# Patient Record
Sex: Female | Born: 1990 | Race: Black or African American | Hispanic: No | Marital: Single | Smoking: Former smoker
Health system: Southern US, Community
[De-identification: ages and names within clinical notes are randomized; demographics above are authoritative.]

## PROBLEM LIST (undated history)

## (undated) ENCOUNTER — Inpatient Hospital Stay (HOSPITAL_COMMUNITY): Payer: Self-pay

## (undated) DIAGNOSIS — D219 Benign neoplasm of connective and other soft tissue, unspecified: Secondary | ICD-10-CM

---
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
## 2009-10-30 ENCOUNTER — Inpatient Hospital Stay (HOSPITAL_COMMUNITY): Admission: AD | Admit: 2009-10-30 | Discharge: 2009-10-30 | Payer: Self-pay | Admitting: Obstetrics & Gynecology

## 2010-09-01 LAB — URINALYSIS, ROUTINE W REFLEX MICROSCOPIC
Bilirubin Urine: NEGATIVE
Glucose, UA: NEGATIVE mg/dL
Leukocytes, UA: NEGATIVE
Nitrite: NEGATIVE
Specific Gravity, Urine: >1.03 — ABNORMAL HIGH (ref 1.005–1.030)

## 2010-09-01 LAB — WET PREP, GENITAL
Trich, Wet Prep: NONE SEEN
Yeast Wet Prep HPF POC: NONE SEEN

## 2010-09-01 LAB — GC/CHLAMYDIA PROBE AMP, GENITAL: Chlamydia, DNA Probe: NEGATIVE

## 2010-09-01 LAB — URINE MICROSCOPIC-ADD ON

## 2010-09-01 LAB — CBC
MCV: 96 fL (ref 78.0–100.0)
WBC: 6.7 10*3/uL (ref 4.0–10.5)

## 2010-09-01 LAB — ABO/RH: ABO/RH(D): O POS

## 2010-09-01 LAB — HCG, QUANTITATIVE, PREGNANCY: hCG, Beta Chain, Quant, S: 21216 m[IU]/mL — ABNORMAL HIGH (ref <5)

## 2010-09-06 ENCOUNTER — Emergency Department (HOSPITAL_COMMUNITY): Payer: Self-pay

## 2010-09-06 ENCOUNTER — Emergency Department (HOSPITAL_COMMUNITY)
Admission: EM | Admit: 2010-09-06 | Discharge: 2010-09-06 | Disposition: A | Discharge status: Home / Self Care | Payer: Self-pay | Attending: Emergency Medicine | Admitting: Emergency Medicine

## 2010-09-06 DIAGNOSIS — R0789 Other chest pain: Secondary | ICD-10-CM | POA: Insufficient documentation

## 2010-09-06 DIAGNOSIS — M940 Chondrocostal junction syndrome [Tietze]: Secondary | ICD-10-CM | POA: Insufficient documentation

## 2010-09-06 DIAGNOSIS — R42 Dizziness and giddiness: Secondary | ICD-10-CM | POA: Insufficient documentation
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
## 2010-11-14 ENCOUNTER — Inpatient Hospital Stay (HOSPITAL_COMMUNITY)
Admission: AD | Admit: 2010-11-14 | Discharge: 2010-11-14 | Disposition: A | Discharge status: Home / Self Care | Payer: Medicaid Other | Source: Ambulatory Visit | Attending: Obstetrics & Gynecology | Admitting: Obstetrics & Gynecology

## 2010-11-14 ENCOUNTER — Inpatient Hospital Stay (HOSPITAL_COMMUNITY): Payer: Medicaid Other

## 2010-11-14 DIAGNOSIS — R109 Unspecified abdominal pain: Secondary | ICD-10-CM | POA: Insufficient documentation

## 2010-11-14 DIAGNOSIS — O99891 Other specified diseases and conditions complicating pregnancy: Secondary | ICD-10-CM | POA: Insufficient documentation

## 2010-11-14 DIAGNOSIS — O9989 Other specified diseases and conditions complicating pregnancy, childbirth and the puerperium: Secondary | ICD-10-CM

## 2010-11-14 LAB — POCT PREGNANCY, URINE: Preg Test, Ur: POSITIVE

## 2010-11-14 LAB — URINALYSIS, ROUTINE W REFLEX MICROSCOPIC
Glucose, UA: NEGATIVE mg/dL
Nitrite: NEGATIVE
Protein, ur: NEGATIVE mg/dL
pH: 6 (ref 5.0–8.0)

## 2010-11-15 LAB — GC/CHLAMYDIA PROBE AMP, GENITAL
Chlamydia, DNA Probe: NEGATIVE
GC Probe Amp, Genital: NEGATIVE

## 2010-12-18 LAB — HEPATITIS B SURFACE ANTIGEN: Hepatitis B Surface Ag: NEGATIVE

## 2010-12-18 LAB — RUBELLA ANTIBODY, IGM: Rubella: IMMUNE

## 2010-12-18 LAB — RPR: RPR: NONREACTIVE

## 2011-02-20 ENCOUNTER — Inpatient Hospital Stay (HOSPITAL_COMMUNITY): Payer: Medicaid Other

## 2011-02-20 ENCOUNTER — Inpatient Hospital Stay (HOSPITAL_COMMUNITY)
Admission: AD | Admit: 2011-02-20 | Discharge: 2011-02-22 | DRG: 781 | Disposition: A | Discharge status: Home / Self Care | Payer: Medicaid Other | Source: Ambulatory Visit | Attending: Obstetrics and Gynecology | Admitting: Obstetrics and Gynecology

## 2011-02-20 ENCOUNTER — Encounter (HOSPITAL_COMMUNITY): Payer: Self-pay | Admitting: *Deleted

## 2011-02-20 DIAGNOSIS — O26899 Other specified pregnancy related conditions, unspecified trimester: Secondary | ICD-10-CM

## 2011-02-20 DIAGNOSIS — R102 Pelvic and perineal pain unspecified side: Secondary | ICD-10-CM

## 2011-02-20 DIAGNOSIS — O210 Mild hyperemesis gravidarum: Secondary | ICD-10-CM | POA: Diagnosis present

## 2011-02-20 DIAGNOSIS — R1031 Right lower quadrant pain: Secondary | ICD-10-CM | POA: Diagnosis present

## 2011-02-20 DIAGNOSIS — O99891 Other specified diseases and conditions complicating pregnancy: Principal | ICD-10-CM | POA: Diagnosis present

## 2011-02-20 HISTORY — DX: Benign neoplasm of connective and other soft tissue, unspecified: D21.9

## 2011-02-20 LAB — COMPREHENSIVE METABOLIC PANEL
AST: 30 U/L (ref 0–37)
Alkaline Phosphatase: 46 U/L (ref 39–117)
BUN: 8 mg/dL (ref 6–23)
CO2: 24 mEq/L (ref 19–32)
Chloride: 101 mEq/L (ref 96–112)
Creatinine, Ser: <0.47 mg/dL — ABNORMAL LOW (ref 0.50–1.10)
Total Bilirubin: 0.2 mg/dL — ABNORMAL LOW (ref 0.3–1.2)

## 2011-02-20 LAB — CBC
HCT: 34.5 % — ABNORMAL LOW (ref 36.0–46.0)
MCV: 94.5 fL (ref 78.0–100.0)
Platelets: 270 10*3/uL (ref 150–400)
RBC: 3.65 MIL/uL — ABNORMAL LOW (ref 3.87–5.11)
WBC: 9.8 10*3/uL (ref 4.0–10.5)

## 2011-02-20 LAB — URINALYSIS, ROUTINE W REFLEX MICROSCOPIC
Bilirubin Urine: NEGATIVE
Glucose, UA: NEGATIVE mg/dL
Ketones, ur: NEGATIVE mg/dL
Protein, ur: NEGATIVE mg/dL
Specific Gravity, Urine: 1.01 (ref 1.005–1.030)
pH: 6 (ref 5.0–8.0)

## 2011-02-20 LAB — AMYLASE: Amylase: 136 U/L — ABNORMAL HIGH (ref 0–105)

## 2011-02-20 LAB — LIPASE, BLOOD: Lipase: 36 U/L (ref 11–59)

## 2011-02-20 LAB — WET PREP, GENITAL

## 2011-02-20 MED ORDER — LACTATED RINGERS IV SOLN
INTRAVENOUS | Status: DC
  Administered 2011-02-20: 125 mL/h via INTRAVENOUS
  Administered 2011-02-20 – 2011-02-22 (×6): via INTRAVENOUS

## 2011-02-20 MED ORDER — LACTATED RINGERS IV BOLUS (SEPSIS)
500.0000 mL | Freq: Once | INTRAVENOUS | Status: AC
  Administered 2011-02-20: 1000 mL via INTRAVENOUS

## 2011-02-20 MED ORDER — BUTORPHANOL TARTRATE 2 MG/ML IJ SOLN
INTRAMUSCULAR | Status: AC
  Administered 2011-02-20: 1 mg via INTRAVENOUS
  Filled 2011-02-20: qty 1

## 2011-02-20 MED ORDER — CALCIUM CARBONATE ANTACID 500 MG PO CHEW: 2.0000 | CHEWABLE_TABLET | ORAL | Status: DC | PRN

## 2011-02-20 MED ORDER — DOCUSATE SODIUM 100 MG PO CAPS
100.0000 mg | ORAL_CAPSULE | Freq: Every day | ORAL | Status: DC
  Administered 2011-02-21 – 2011-02-22 (×2): 100 mg via ORAL
  Filled 2011-02-20 (×2): qty 1

## 2011-02-20 MED ORDER — IBUPROFEN 600 MG PO TABS
600.0000 mg | ORAL_TABLET | Freq: Four times a day (QID) | ORAL | Status: DC | PRN
  Administered 2011-02-21: 600 mg via ORAL
  Filled 2011-02-20: qty 1

## 2011-02-20 MED ORDER — ZOLPIDEM TARTRATE 10 MG PO TABS
10.0000 mg | ORAL_TABLET | Freq: Every evening | ORAL | Status: DC | PRN
  Administered 2011-02-21: 10 mg via ORAL
  Filled 2011-02-20 (×2): qty 1

## 2011-02-20 MED ORDER — BUTORPHANOL TARTRATE 2 MG/ML IJ SOLN
1.0000 mg | INTRAMUSCULAR | Status: DC | PRN
  Administered 2011-02-20 – 2011-02-21 (×3): 1 mg via INTRAVENOUS
  Filled 2011-02-20 (×2): qty 1

## 2011-02-20 MED ORDER — ACETAMINOPHEN 500 MG PO TABS
1000.0000 mg | ORAL_TABLET | Freq: Four times a day (QID) | ORAL | Status: DC | PRN
  Administered 2011-02-20: 1000 mg via ORAL
  Filled 2011-02-20: qty 2

## 2011-02-20 MED ORDER — PRENATAL PLUS 27-1 MG PO TABS
1.0000 | ORAL_TABLET | Freq: Every day | ORAL | Status: DC
  Administered 2011-02-21 – 2011-02-22 (×2): 1 via ORAL
  Filled 2011-02-20 (×2): qty 1

## 2011-02-20 NOTE — ED Notes (Signed)
Pt to be evaluated in BS #166

## 2011-02-21 ENCOUNTER — Encounter (HOSPITAL_COMMUNITY): Payer: Self-pay | Admitting: *Deleted

## 2011-02-21 ENCOUNTER — Inpatient Hospital Stay (HOSPITAL_COMMUNITY): Payer: Medicaid Other

## 2011-02-21 ENCOUNTER — Ambulatory Visit (HOSPITAL_COMMUNITY)
Admission: EM | Admit: 2011-02-21 | Discharge: 2011-02-21 | Disposition: A | Discharge status: Home / Self Care | Payer: Medicaid Other | Source: Ambulatory Visit | Attending: Obstetrics and Gynecology | Admitting: Obstetrics and Gynecology

## 2011-02-21 LAB — DIFFERENTIAL
Basophils Relative: 0 % (ref 0–1)
Eosinophils Absolute: 0.1 10*3/uL (ref 0.0–0.7)
Lymphs Abs: 1.5 10*3/uL (ref 0.7–4.0)
Monocytes Relative: 8 % (ref 3–12)
Neutro Abs: 10.4 10*3/uL — ABNORMAL HIGH (ref 1.7–7.7)
Neutrophils Relative %: 80 % — ABNORMAL HIGH (ref 43–77)

## 2011-02-21 LAB — COMPREHENSIVE METABOLIC PANEL
ALT: 43 U/L — ABNORMAL HIGH (ref 0–35)
Albumin: 2.6 g/dL — ABNORMAL LOW (ref 3.5–5.2)
Alkaline Phosphatase: 51 U/L (ref 39–117)
BUN: 6 mg/dL (ref 6–23)
Chloride: 102 mEq/L (ref 96–112)
Glucose, Bld: 98 mg/dL (ref 70–99)
Potassium: 3.5 mEq/L (ref 3.5–5.1)
Sodium: 134 mEq/L — ABNORMAL LOW (ref 135–145)
Total Bilirubin: 0.3 mg/dL (ref 0.3–1.2)
Total Protein: 6.4 g/dL (ref 6.0–8.3)

## 2011-02-21 LAB — GC/CHLAMYDIA PROBE AMP, GENITAL: Chlamydia, DNA Probe: NEGATIVE

## 2011-02-21 LAB — CBC
Hemoglobin: 11.5 g/dL — ABNORMAL LOW (ref 12.0–15.0)
Platelets: 254 10*3/uL (ref 150–400)
RBC: 3.6 MIL/uL — ABNORMAL LOW (ref 3.87–5.11)

## 2011-02-21 MED ORDER — BUTORPHANOL TARTRATE 2 MG/ML IJ SOLN
2.0000 mg | INTRAMUSCULAR | Status: DC | PRN
  Administered 2011-02-21 (×2): 2 mg via INTRAVENOUS
  Filled 2011-02-21 (×2): qty 1

## 2011-02-21 NOTE — H&P (Signed)
Rita Long is a 20 y.o. female presenting for onset of abdominal pain, points to suprapubic area and lateral groin area and sometimes around to middle back. Denies ctx, VB, LOF +FM Maternal Medical History:  Reason for admission: Reason for Admission:   nauseaFetal activity: Perceived fetal activity is normal.   Last perceived fetal movement was within the past hour.     Anat US revealed marginal cord insertion.   OB History    Grav Para Term Preterm Abortions TAB SAB Ect Mult Living   3 0 0 0 2          Past Medical History  Diagnosis Date  . Fibroid    History reviewed. No pertinent past surgical history. Family History: family history is not on file. Social History:  reports that she has never smoked. She has never used smokeless tobacco. She reports that she does not drink alcohol or use illicit drugs.  Review of Systems  Gastrointestinal: Positive for abdominal pain. Negative for nausea, vomiting and diarrhea.  Genitourinary: Negative for flank pain.  Musculoskeletal: Positive for back pain.  All other systems reviewed and are negative.    Dilation: Closed Effacement (%): Thick Exam by:: Dr Su Hilt Blood pressure 100/63, pulse 82, temperature 98.2 F (36.8 C), temperature source Oral, resp. rate 18, height 4\' 11"  (1.499 m), weight 66.225 kg (146 lb), SpO2 99.00%. Maternal Exam:  Abdomen: Patient reports generalized tenderness.  Patient reports the following abdominal tenderness: suprapubic.  Introitus: not evaluated.     Fetal Exam Fetal Monitor Review: Mode: hand-held doppler probe.       Physical Exam  Nursing note and vitals reviewed. Constitutional: She is oriented to person, place, and time. She appears well-developed and well-nourished.  Cardiovascular: Normal rate.   Respiratory: Effort normal.  GI: Soft. Bowel sounds are normal. She exhibits no distension. There is generalized tenderness and tenderness in the suprapubic area. There is guarding.  There is no rebound, no tenderness at McBurney's point and negative Murphy's sign.  Neurological: She is alert and oriented to person, place, and time.  Skin: Skin is warm and dry.  Psychiatric: She has a normal mood and affect. Her behavior is normal.    Prenatal labs: ABO, Rh:   Antibody: Negative (07/05 0000) Rubella:   RPR: Nonreactive (07/05 0000)  HBsAg: Negative (07/05 0000)  HIV: Non-reactive (07/05 0000)  GBS:     Assessment/Plan: Abdominal and OB US WNL Lab results WNL Pain somewhat decreased after stadol - patient requests tylenol  Will admit overnight to evaluate pain - per Dr Su Hilt Continue IVF's Pt may eat Tylenol and ambien PRN     Krystl Wickware M 02/21/2011, 6:37 AM

## 2011-02-21 NOTE — Progress Notes (Signed)
Subjective: Patient reports nausea and vomiting and abdominal pain and looks uncomfortable.  Pt points repeatedly to her RLQ.  Objective: I have reviewed patient's vital signs at initial triage eval and they are within normal limits.  General: alert and visibly uncomfortable Resp: clear to auscultation bilaterally Cardio: regular rate and rhythm, S1, S2 normal, no murmur, click, rub or gallop GI: gravid, + diffuse lower abdominal tenderness, no rebound, + guarding Extremities: extremities normal, atraumatic, no cyanosis or edema and Homans sign is negative, no sign of DVT Speculum: no abnormal d/c VE:c/l/p with bilateral and lower midline pelvic tenderness, no obvious CMT   Assessment/Plan: 20 yo G3P0 at 22 4/7 wks presenting primarily with c/o abdominal pain but also c/o nausea and episode of vomiting earlier today. Labs Ultrasound (Ob and Abdominal) Pain medicine per request Pt declining CT scan to eval for appendicitis but may consider MRI   LOS: 1 day    Rita Long Y 02/21/2011, 12:03 PM

## 2011-02-21 NOTE — Progress Notes (Signed)
Rita Long is a 20 y.o. G3P0020 at [redacted]w[redacted]d admitted for abdominal pain, n/v.  Subjective: Pt continues to c/o pain 10/10 even after the stadol.  Denies any nausea and tolerated dinner last night without difficult.  She c/o pressure in lower abdomen upon urination and the pain radiates around to her rt flank and also down left leg.  Objective: BP 105/68  Pulse 74  Temp(Src) 97.8 F (36.6 C) (Oral)  Resp 18  Ht 4\' 11"  (1.499 m)  Wt 66.225 kg (146 lb)  BMI 29.49 kg/m2  SpO2 100%   Lings: CTA bil CV: RRR Abdomen: soft, + lower abdominal tenderness, no bil CVAT Ext: no calf tenderness  FHT:  145 SVE:   Dilation: Closed Effacement (%): Thick Exam by:: Dr Su Hilt  Labs: Lab Results  Component Value Date   WBC 9.8 02/20/2011   HGB 11.7* 02/20/2011   HCT 34.5* 02/20/2011   MCV 94.5 02/20/2011   PLT 270 02/20/2011   Pertinent Postives UA with RBCs Amylase elevated Abdominal ultrasound - negative  Assessment / Plan: 20 yo G3P0 at 26 4/7wks with lower abdominal and right flank pain.  I discussed further imaging studies to eval pain esp for signs of kidney stone with RBCs in urine and patient continues to decline CT Scan but agrees to MRI.  Will order MRI for eval of kidney stone and eval for appendicitis, although low suspicion secondary to pt still has appetite and normal WBCs.    Purcell Nails 02/21/2011, 12:18 PM

## 2011-02-21 NOTE — Progress Notes (Addendum)
Patient complaining of lower abd pain, given Tylenol 1000mg  po @ 2300. Pain re assesed and has decreased. Pt asked for sleeping pill @ 0001. Patient was given Ambien 10mg  po. Pt. Has since slept through the night with no other complaints of pain. Pt. Scheduled for abd u/s this am. Will continue to monitor pt for complaints of pain or concerns.   Error, pt. Not scheduled for abd U/S in am. U/S was done 9/7 @ 2040.

## 2011-02-22 LAB — HEPATIC FUNCTION PANEL
ALT: 34 U/L (ref 0–35)
AST: 21 U/L (ref 0–37)
Albumin: 2.2 g/dL — ABNORMAL LOW (ref 3.5–5.2)
Alkaline Phosphatase: 43 U/L (ref 39–117)
Total Protein: 5.7 g/dL — ABNORMAL LOW (ref 6.0–8.3)

## 2011-02-22 LAB — CBC
Hemoglobin: 10.6 g/dL — ABNORMAL LOW (ref 12.0–15.0)
Platelets: 236 10*3/uL (ref 150–400)
RBC: 3.32 MIL/uL — ABNORMAL LOW (ref 3.87–5.11)
WBC: 7.6 10*3/uL (ref 4.0–10.5)

## 2011-02-22 LAB — AMYLASE: Amylase: 107 U/L — ABNORMAL HIGH (ref 0–105)

## 2011-02-22 LAB — DIFFERENTIAL
Eosinophils Absolute: 0.1 10*3/uL (ref 0.0–0.7)
Lymphocytes Relative: 21 % (ref 12–46)
Lymphs Abs: 1.6 10*3/uL (ref 0.7–4.0)
Monocytes Relative: 10 % (ref 3–12)
Neutro Abs: 5.1 10*3/uL (ref 1.7–7.7)
Neutrophils Relative %: 68 % (ref 43–77)

## 2011-02-22 NOTE — Discharge Summary (Signed)
Obstetric Discharge Summary Reason for Admission: abdominal pain Prenatal Procedures: observation and imaging studies Intrapartum Procedures: n/a Postpartum Procedures: n/a Complications-Operative and Postpartum: n/a Hemoglobin  Date Value Range Status  02/22/2011 10.6* 12.0-15.0 (g/dL) Final     HCT  Date Value Range Status  02/22/2011 32.0* 36.0-46.0 (%) Final    Discharge Diagnoses: Pregnant with Abdominal Pain (Resolved)  Discharge Information: Date: 02/22/2011 Activity: unrestricted Diet: routine Medications: PNV Condition: improved Instructions: d/c instructions reviewed...call with return or worsening of symptoms Discharge to: home Follow-up Information    Follow up with [facility]. (as scheduled...call with return or worsening of symptoms)          Newborn Data: This patient has no babies on file. Home with n/a.  Purcell Nails 02/22/2011, 2:05 PM

## 2011-02-22 NOTE — Discharge Summary (Signed)
Patient was very eager to leave, she had no complaints. After RN reviewed discharge instructions with her, she was walked off the unit by nurse tech. Patient left the campus in a private vehicle.

## 2011-02-22 NOTE — Progress Notes (Signed)
Rita Long is a 20 y.o. G3P0020 at [redacted]w[redacted]d admitted for severe abdominal pain.  Subjective: Pt says her pain is completely better today and she feels no pain at all.  Tolerating po, ambulating without difficulty and requesting to go home.  Denies any further n/v.  Objective: BP 117/74  Pulse 90  Temp(Src) 98.1 F (36.7 C) (Oral)  Resp 18  Ht 4\' 11"  (1.499 m)  Wt 66.225 kg (146 lb)  BMI 29.49 kg/m2  SpO2 100% I/O last 3 completed shifts: In: 5723.3 [P.O.:1440; I.V.:4283.3] Out: 1400 [Urine:1400] Total I/O In: 500 [P.O.:500] Out: 800 [Urine:800]  Lungs: CTA bil CV: RRR Abd: soft, NT Ext: no calf tenderness  FHT:  145 UC:   none SVE:   Dilation: Closed Effacement (%): Thick Exam by:: Dr Su Hilt  Labs: Lab Results  Component Value Date   WBC 7.6 02/22/2011   HGB 10.6* 02/22/2011   HCT 32.0* 02/22/2011   MCV 96.4 02/22/2011   PLT 236 02/22/2011    Assessment / Plan: 20yo G3P0 at 19 5/7wks doing well requesting d/c home.  D/c home. D/c instructions discussed.  f/u for scheduled appointment.  Amisha Pospisil Y 02/22/2011, 1:51 PM

## 2011-02-22 NOTE — Plan of Care (Signed)
Problem: Phase I Progression Outcomes Goal: Initial discharge plan identified Outcome: Progressing Pain control R/O Kidney stones and gallstones Assess fetal well being  Problem: Phase II Progression Outcomes Goal: Progress activity as tolerated unless otherwise ordered Outcome: Progressing Patient sits up in room and has walked in the halls

## 2011-02-23 LAB — URINE CULTURE
Colony Count: NO GROWTH
Culture: NO GROWTH

## 2011-02-23 LAB — CULTURE, BETA STREP (GROUP B ONLY)

## 2011-06-18 ENCOUNTER — Other Ambulatory Visit: Payer: Self-pay | Admitting: Obstetrics and Gynecology

## 2011-06-18 ENCOUNTER — Inpatient Hospital Stay (HOSPITAL_COMMUNITY)
Admission: AD | Admit: 2011-06-18 | Discharge: 2011-06-18 | Disposition: A | Discharge status: Home / Self Care | Payer: Medicaid Other | Source: Ambulatory Visit | Attending: Obstetrics and Gynecology | Admitting: Obstetrics and Gynecology

## 2011-06-18 ENCOUNTER — Encounter (HOSPITAL_COMMUNITY): Payer: Self-pay

## 2011-06-18 DIAGNOSIS — O321XX0 Maternal care for breech presentation, not applicable or unspecified: Secondary | ICD-10-CM

## 2011-06-18 DIAGNOSIS — O479 False labor, unspecified: Secondary | ICD-10-CM

## 2011-06-18 DIAGNOSIS — O4700 False labor before 37 completed weeks of gestation, unspecified trimester: Secondary | ICD-10-CM | POA: Insufficient documentation

## 2011-06-18 LAB — CBC
HCT: 36.4 % (ref 36.0–46.0)
Hemoglobin: 12.6 g/dL (ref 12.0–15.0)
MCHC: 34.6 g/dL (ref 30.0–36.0)
MCV: 97.3 fL (ref 78.0–100.0)
RDW: 13.4 % (ref 11.5–15.5)

## 2011-06-18 MED ORDER — BUTORPHANOL TARTRATE 2 MG/ML IJ SOLN
1.0000 mg | Freq: Once | INTRAMUSCULAR | Status: AC
  Administered 2011-06-18: 1 mg via INTRAVENOUS
  Filled 2011-06-18: qty 1

## 2011-06-18 MED ORDER — LACTATED RINGERS IV BOLUS (SEPSIS)
1000.0000 mL | Freq: Once | INTRAVENOUS | Status: AC
  Administered 2011-06-18: 1000 mL via INTRAVENOUS

## 2011-06-18 MED ORDER — LACTATED RINGERS IV SOLN
INTRAVENOUS | Status: DC
  Administered 2011-06-18: 19:00:00 via INTRAVENOUS

## 2011-06-18 MED ORDER — ZOLPIDEM TARTRATE 10 MG PO TABS: 10.0000 mg | ORAL_TABLET | Freq: Every evening | ORAL | Status: DC | PRN

## 2011-06-18 MED ORDER — PROMETHAZINE HCL 25 MG/ML IJ SOLN
12.5000 mg | Freq: Once | INTRAMUSCULAR | Status: AC
  Administered 2011-06-18: 12.5 mg via INTRAVENOUS
  Filled 2011-06-18: qty 1

## 2011-06-18 NOTE — ED Provider Notes (Signed)
History   21 yo G3P0020 at 82 2/7 weeks presented after calling office with UCs q7 minutes x several hours today.  Denies leaking or bleeding, reports +FM.  Breech presentation identified on [place] yesterday, with C/S scheduled on 1/24.  Cervix was closed yesterday.  Pregnancy remarkable for: Breech presentation Small stature TABs x 2 Hx fibroid on 1st trimester [place] ? LMP, with dating by 10 week [place] ? Conception on OCPs  Chief Complaint  Patient presents with  . Contractions     OB History    Grav Para Term Preterm Abortions TAB SAB Ect Mult Living   3 0 0 0 2 2 0 0 0 0     #1--2007 TAB #2--2011 TAB  Past Medical History  Diagnosis Date  . Fibroid   Previous OCP user, d/c'd 10/2010  History reviewed. No pertinent past surgical history.  Family History  Problem Relation Age of Onset  . Hypertension Mother     History  Substance Use Topics  . Smoking status: Former Smoker -- 0.2 packs/day    Quit date: 06/22/2010  . Smokeless tobacco: Never Used   Comment: not currently smoking or using marijuana  . Alcohol Use: No    Allergies: No Known Allergies  Prescriptions prior to admission  Medication Sig Dispense Refill  . Prenatal Vit-Fe Fumarate-FA (PRENATAL MULTIVITAMIN) TABS Take 1 tablet by mouth daily.        Marland Kitchen DISCONTD: prenatal vitamin w/FE, FA (PRENATAL 1 + 1) 27-1 MG TABS Take 1 tablet by mouth daily.           Physical Exam   Blood pressure 128/69, pulse 86, temperature 99.3 F (37.4 C), temperature source Oral, resp. rate 20, height 5' 0.5" (1.537 m), weight 74.844 kg (165 lb), SpO2 99.00%.  Chest clear  Heart RRR without murmur Abd gravid, NT Pelvic--cervix closed, long, soft, breech (?footling) Ext WNL  Breech presentation verified by bedside US  FHR reactive, no decels UCs q 4 minutes, mild/mod.  ED Course  IUP at 36 2/7 weeks Early vs. Prodromal labor Breech presentation  Plan: Consulted with Dr. Su Hilt Plan IV hydration, and re-evaluation  after hydration Will get CBC, RPR in case surgery occurs.  Nigel Bridgeman, CNM, [place] 06/18/11 6:30pm

## 2011-06-18 NOTE — Progress Notes (Signed)
Patient states she has been having contractions every 7 minutes for about 3 hours. Denies any bleeding or leaking and reports good fetal movement. Patient is scheduled for a primary cesarean section on 1-24 due to breech presentation.

## 2011-06-18 NOTE — Progress Notes (Signed)
S:  Pt received 1mg  Stadol IV and 12.5 mg Phenergan just before 2030.  Able to rest since, but still reports pain w/ ctxs same, although does state "drowsy" when they occur.  No LOF or VB.  GFM.  Pt's mother remains at her bedside. Pt has received 1.5 liters IVF approximately. O:   Filed Vitals:   06/18/11 1738  BP: 128/69  Pulse: 86  Temp: 99.3 F (37.4 C)  TempSrc: Oral  Resp: 20  Height: 5' 0.5" (1.537 m)  Weight: 74.844 kg (165 lb)  SpO2: 99%  EFM:  140, reactive, no decels, moderate variability TOCO;  Spacing to irregular pattern  Cx:  Unchanged;  Closed/thick/-2, breech  A:  1.  IUP at 36.2      2.  cx closed and no cx change in 3.5 hrs of monitoring      3.  Breech presentation      4.  Reactive NST P:  1.  D/c home w/ labor precautions and FKC      2.  Ambien Rx given      3.  F/u next week at [facility] as scheduled or prn  C. Denny Levy, [place] 06/18/11, 2136

## 2011-06-19 ENCOUNTER — Encounter (HOSPITAL_COMMUNITY): Payer: Self-pay | Admitting: *Deleted

## 2011-06-19 ENCOUNTER — Observation Stay (HOSPITAL_COMMUNITY)
Admission: AD | Admit: 2011-06-19 | Discharge: 2011-06-20 | DRG: 781 | Disposition: A | Discharge status: Home / Self Care | Payer: Medicaid Other | Source: Ambulatory Visit | Attending: Obstetrics and Gynecology | Admitting: Obstetrics and Gynecology

## 2011-06-19 DIAGNOSIS — O99891 Other specified diseases and conditions complicating pregnancy: Principal | ICD-10-CM | POA: Diagnosis present

## 2011-06-19 DIAGNOSIS — O321XX0 Maternal care for breech presentation, not applicable or unspecified: Secondary | ICD-10-CM | POA: Diagnosis present

## 2011-06-19 DIAGNOSIS — M549 Dorsalgia, unspecified: Secondary | ICD-10-CM

## 2011-06-19 DIAGNOSIS — R109 Unspecified abdominal pain: Secondary | ICD-10-CM

## 2011-06-19 LAB — URINALYSIS, ROUTINE W REFLEX MICROSCOPIC
Bilirubin Urine: NEGATIVE
Leukocytes, UA: NEGATIVE
Nitrite: NEGATIVE
Specific Gravity, Urine: 1.01 (ref 1.005–1.030)
pH: 7 (ref 5.0–8.0)

## 2011-06-19 LAB — CBC
Hemoglobin: 12.3 g/dL (ref 12.0–15.0)
RBC: 3.68 MIL/uL — ABNORMAL LOW (ref 3.87–5.11)
WBC: 8.3 10*3/uL (ref 4.0–10.5)

## 2011-06-19 LAB — DIFFERENTIAL
Lymphocytes Relative: 20 % (ref 12–46)
Lymphs Abs: 1.6 10*3/uL (ref 0.7–4.0)
Monocytes Relative: 12 % (ref 3–12)
Neutro Abs: 5.6 10*3/uL (ref 1.7–7.7)
Neutrophils Relative %: 68 % (ref 43–77)

## 2011-06-19 LAB — URINE MICROSCOPIC-ADD ON

## 2011-06-19 NOTE — Progress Notes (Signed)
Pt states, " I started having pain in my whole right side  this morning and tonight it has moved into my back."

## 2011-06-19 NOTE — Progress Notes (Signed)
Pt reports back and R side pain. Denies LOF or Vag. Bleeding.

## 2011-06-20 ENCOUNTER — Inpatient Hospital Stay (HOSPITAL_COMMUNITY): Payer: Medicaid Other

## 2011-06-20 ENCOUNTER — Other Ambulatory Visit: Payer: Self-pay | Admitting: Obstetrics and Gynecology

## 2011-06-20 LAB — LIPASE, BLOOD: Lipase: 26 U/L (ref 11–59)

## 2011-06-20 LAB — COMPREHENSIVE METABOLIC PANEL
ALT: 19 U/L (ref 0–35)
AST: 18 U/L (ref 0–37)
Albumin: 2.5 g/dL — ABNORMAL LOW (ref 3.5–5.2)
CO2: 22 mEq/L (ref 19–32)
Calcium: 8.8 mg/dL (ref 8.4–10.5)
Chloride: 105 mEq/L (ref 96–112)
Creatinine, Ser: 0.62 mg/dL (ref 0.50–1.10)
GFR calc non Af Amer: >90 mL/min (ref >90)
Sodium: 137 mEq/L (ref 135–145)

## 2011-06-20 MED ORDER — CYCLOBENZAPRINE HCL 10 MG PO TABS
10.0000 mg | ORAL_TABLET | Freq: Once | ORAL | Status: AC
  Administered 2011-06-20: 10 mg via ORAL
  Filled 2011-06-20: qty 1

## 2011-06-20 MED ORDER — CALCIUM CARBONATE ANTACID 500 MG PO CHEW: 2.0000 | CHEWABLE_TABLET | ORAL | Status: DC | PRN

## 2011-06-20 MED ORDER — CYCLOBENZAPRINE HCL 10 MG PO TABS
10.0000 mg | ORAL_TABLET | Freq: Three times a day (TID) | ORAL | Status: DC
  Administered 2011-06-20: 10 mg via ORAL
  Filled 2011-06-20 (×5): qty 1

## 2011-06-20 MED ORDER — ZOLPIDEM TARTRATE 10 MG PO TABS: 10.0000 mg | ORAL_TABLET | Freq: Every evening | ORAL | Status: DC | PRN

## 2011-06-20 MED ORDER — CYCLOBENZAPRINE HCL 10 MG PO TABS: 10.0000 mg | ORAL_TABLET | Freq: Three times a day (TID) | ORAL | Status: DC | PRN

## 2011-06-20 MED ORDER — PROMETHAZINE HCL 25 MG/ML IJ SOLN
25.0000 mg | Freq: Once | INTRAMUSCULAR | Status: AC
  Administered 2011-06-20: 25 mg via INTRAMUSCULAR
  Filled 2011-06-20: qty 1

## 2011-06-20 MED ORDER — OXYCODONE-ACETAMINOPHEN 5-325 MG PO TABS: 2.0000 | ORAL_TABLET | ORAL | Status: DC | PRN

## 2011-06-20 MED ORDER — CITRIC ACID-SODIUM CITRATE 334-500 MG/5ML PO SOLN
30.0000 mL | Freq: Once | ORAL | Status: AC
  Administered 2011-06-20: 30 mL via ORAL

## 2011-06-20 MED ORDER — LACTATED RINGERS IV SOLN
INTRAVENOUS | Status: DC
  Administered 2011-06-20 (×3): via INTRAVENOUS

## 2011-06-20 MED ORDER — OXYCODONE-ACETAMINOPHEN 5-325 MG PO TABS
2.0000 | ORAL_TABLET | Freq: Once | ORAL | Status: AC
  Administered 2011-06-20: 2 via ORAL
  Filled 2011-06-20: qty 2

## 2011-06-20 MED ORDER — BUTORPHANOL TARTRATE 2 MG/ML IJ SOLN
2.0000 mg | Freq: Once | INTRAMUSCULAR | Status: AC
  Administered 2011-06-20: 2 mg via INTRAMUSCULAR
  Filled 2011-06-20: qty 1

## 2011-06-20 MED ORDER — PRENATAL MULTIVITAMIN CH: 1.0000 | ORAL_TABLET | Freq: Every day | ORAL | Status: DC

## 2011-06-20 MED ORDER — LACTATED RINGERS IV BOLUS (SEPSIS): 500.0000 mL | Freq: Once | INTRAVENOUS | Status: DC

## 2011-06-20 MED ORDER — ACETAMINOPHEN 325 MG PO TABS: 650.0000 mg | ORAL_TABLET | ORAL | Status: DC | PRN

## 2011-06-20 MED ORDER — OXYCODONE-ACETAMINOPHEN 5-325 MG PO TABS
1.0000 | ORAL_TABLET | ORAL | Status: DC | PRN
  Administered 2011-06-20: 2 via ORAL
  Filled 2011-06-20: qty 2

## 2011-06-20 MED ORDER — DOCUSATE SODIUM 100 MG PO CAPS: 100.0000 mg | ORAL_CAPSULE | Freq: Every day | ORAL | Status: DC

## 2011-06-20 MED ORDER — CITRIC ACID-SODIUM CITRATE 334-500 MG/5ML PO SOLN
ORAL | Status: AC
  Administered 2011-06-20: 30 mL via ORAL
  Filled 2011-06-20: qty 15

## 2011-06-20 MED ORDER — LACTATED RINGERS IV BOLUS (SEPSIS)
500.0000 mL | Freq: Once | INTRAVENOUS | Status: AC
  Administered 2011-06-20: 1000 mL via INTRAVENOUS

## 2011-06-20 NOTE — ED Provider Notes (Signed)
History   Rita Long is a 21y.o. Single black female at 36.4 weeks who presented for eval of "rt-sided pain," last night around 2300.  Pt was d/c'd from MAU Thursday night after being evaluated for ctxs.  Known breech presentation and cx was long/closed.  Pt d/c'd home w/ Ambien Rx, but did not pick up at pharmacy.  Pt called earlier in the day and then again around 2030 to report new onset of rt side pain and had tried Tylenol and stated, "I'm going to have to have something stronger.  Pt received IV Stadol and Phenergan while in MAU Thursday night for ctx pains and when d/c'd was able to rest Thursday night.  Reports adequate water intake.  Denies fever or other illness.  No resp or GI c/o's.  No UTI s/s, VB, or LOF.  Reports GFM.  Accompanied by her mom again this visit.  Has a scheduled c/s for 07/09/11.   Hx r/f:  1.  Breech  2.  Small stature  3.  H/o TAB x2  4.  H/o fibroid on 1st trimester u/s  5.  ?LMP w/ dating by 10 week u/s  6.  ? Conception on OCPs  Chief Complaint  Patient presents with  . Abdominal Pain   HPI  OB History    Grav Para Term Preterm Abortions TAB SAB Ect Mult Living   3 0 0 0 2 2 0 0 0 0       Past Medical History  Diagnosis Date  . Fibroid     No past surgical history on file.  Family History  Problem Relation Age of Onset  . Hypertension Mother   . Anesthesia problems Neg Hx   . Hypotension Neg Hx   . Malignant hyperthermia Neg Hx   . Pseudochol deficiency Neg Hx     History  Substance Use Topics  . Smoking status: Former Smoker -- 0.2 packs/day    Quit date: 06/22/2010  . Smokeless tobacco: Never Used   Comment: not currently smoking or using marijuana  . Alcohol Use: No    Allergies: No Known Allergies  Prescriptions prior to admission  Medication Sig Dispense Refill  . Prenatal Vit-Fe Fumarate-FA (PRENATAL MULTIVITAMIN) TABS Take 1 tablet by mouth daily.        Marland Kitchen zolpidem (AMBIEN) 10 MG tablet Take 1 tablet (10 mg total) by mouth  at bedtime as needed for sleep.  30 tablet  1    ROS--see HPI Physical Exam  .. Results for orders placed during the hospital encounter of 06/19/11 (from the past 24 hour(s))  URINALYSIS, ROUTINE W REFLEX MICROSCOPIC     Status: Abnormal   Collection Time   06/19/11 10:45 PM      Component Value Range   Color, Urine YELLOW  YELLOW    APPearance CLEAR  CLEAR    Specific Gravity, Urine 1.010  1.005 - 1.030    pH 7.0  5.0 - 8.0    Glucose, UA NEGATIVE  NEGATIVE (mg/dL)   Hgb urine dipstick TRACE (*) NEGATIVE    Bilirubin Urine NEGATIVE  NEGATIVE    Ketones, ur NEGATIVE  NEGATIVE (mg/dL)   Protein, ur NEGATIVE  NEGATIVE (mg/dL)   Urobilinogen, UA 0.2  0.0 - 1.0 (mg/dL)   Nitrite NEGATIVE  NEGATIVE    Leukocytes, UA NEGATIVE  NEGATIVE   URINE MICROSCOPIC-ADD ON     Status: Abnormal   Collection Time   06/19/11 10:45 PM      Component Value Range  Squamous Epithelial / LPF FEW (*) RARE    WBC, UA 0-2  <3 (WBC/hpf)   RBC / HPF 0-2  <3 (RBC/hpf)   Bacteria, UA FEW (*) RARE   CBC     Status: Abnormal   Collection Time   06/19/11 11:35 PM      Component Value Range   WBC 8.3  4.0 - 10.5 (K/uL)   RBC 3.68 (*) 3.87 - 5.11 (MIL/uL)   Hemoglobin 12.3  12.0 - 15.0 (g/dL)   HCT 16.1 (*) 09.6 - 46.0 (%)   MCV 97.3  78.0 - 100.0 (fL)   MCH 33.4  26.0 - 34.0 (pg)   MCHC 34.4  30.0 - 36.0 (g/dL)   RDW 04.5  40.9 - 81.1 (%)   Platelets 269  150 - 400 (K/uL)  DIFFERENTIAL     Status: Normal   Collection Time   06/19/11 11:35 PM      Component Value Range   Neutrophils Relative 68  43 - 77 (%)   Neutro Abs 5.6  1.7 - 7.7 (K/uL)   Lymphocytes Relative 20  12 - 46 (%)   Lymphs Abs 1.6  0.7 - 4.0 (K/uL)   Monocytes Relative 12  3 - 12 (%)   Monocytes Absolute 1.0  0.1 - 1.0 (K/uL)   Eosinophils Relative 1  0 - 5 (%)   Eosinophils Absolute 0.0  0.0 - 0.7 (K/uL)   Basophils Relative 0  0 - 1 (%)   Basophils Absolute 0.0  0.0 - 0.1 (K/uL)   Blood pressure 119/69, pulse 81, temperature 97.4 F  (36.3 C), temperature source Oral, resp. rate 18, height 5' 0.25" (1.53 m), weight 76.261 kg (168 lb 2 oz). Tmax on arrival =100.1, but less than 20 minutes later WNL and normal since. Physical Exam  Constitutional: She is oriented to person, place, and time. She appears well-developed and well-nourished. She appears distressed.  Cardiovascular: Normal rate.   Respiratory: Effort normal and breath sounds normal.  GI: Soft. Bowel sounds are normal.       gravid  Genitourinary:       cx on arrival to MAU:  Cl/long/ -3 softening, breech  Musculoskeletal:       Positive rt CVAT  Neurological: She is alert and oriented to person, place, and time.  Skin: Skin is warm and dry.  EFM:  145, min to mod variability, 2 lates in last hr w/ ctxs, otherwise no decels, reactive initially before pain meds TOCO:  UC's q 8-10 min w/ UI inbetween  MAU Course  Procedures 1.  Pt initially offered po vs IM pain when 1st assessed and pt stated she would try what she had when here before b/c "it worked."  Given Stadol 2mg  and Phenergan 25mg  IM x1 at 0039--on reassessment w/in 1-2 hrs, pain in rt side unchanged, so offered po narcotic.  Pt took 2 percocet po at 0203. 2.  U/a 3.  Cbc w/ diff  Assessment and Plan  1.  IUP at 36.4 2.  Rt flank/side pain unknown origin 3.  Breech presentation 4.  Reporting pain unrelieved, but pt able to sleep when out of room 5.  Nml u/a and cbc 6.  No s/s of infection  1.  Per c/w dr. Pennie Rushing, will try Flexeril 10mg  po x1 now and will give IVF bolus 2.  Will reassess in 2-3 hrs, or prn.  Rita Long H 06/20/2011, 7:32 AM

## 2011-06-20 NOTE — Progress Notes (Signed)
1610 Hillary Steelman CNM in to see pt

## 2011-06-20 NOTE — ED Provider Notes (Addendum)
Rita Long is a 21 y.o. G3P0020 at [redacted]w[redacted]d by ultrasound admitted for back and flank pain  Subjective: GI: Denies nausea, vomiting or diarrhea., See HPI., Now has right side and right back pain GU: Denies: dysuria, frequency/urgency, hematuria, vaginal bleeding OB: good fetal movement        Objective: BP 119/69  Pulse 81  Temp(Src) 97.4 F (36.3 C) (Oral)  Resp 18  Ht 5' 0.25" (1.53 m)  Wt 168 lb 2 oz (76.261 kg)  BMI 32.56 kg/m2      FHT:  FHR: 140s bpm, variability: minimal ,  accelerations:  Abscent,  decelerations:  Present but rare UC:   irregular, every  minutes SVE:   Dilation: Closed Effacement (%): Thick Exam by:: Lenise Herald CNM   Labs: Lab Results  Component Value Date   WBC 8.3 06/19/2011   HGB 12.3 06/19/2011   HCT 35.8* 06/19/2011   MCV 97.3 06/19/2011   PLT 269 06/19/2011    Assessment / Plan: Right flank and side pain without evidence of renal stone or infection.  Fetal Wellbeing:  Category II  Plan: Ultrasound for BPP, AFI, and limited eval of uterus and placenta. If all reassuring, will discharge patient home with analgesia, muscle relaxants and local therapy.   Rita Long P 06/20/2011, 10:04 AM  BPP 6/8.  Will admit for observation and repeat BPP in 6 hours

## 2011-06-20 NOTE — Progress Notes (Addendum)
Late entry: 1520 tears, c/o of pain entire R side with movement worse, unable to say if worse after eating ate 14 of hamburger and sides, denies relief with bath, no nausea. O VSS pulse is elevated after bath     fhts LTV mod accels, nst reactive     uc q 2- irregular mild     Lungs clear bilaterally, ap RRR, abd soft, gravid, c/o of pain with lite palpation to R side midline upper abd, R upper       and lower quadrants and R side back, trace edema lower legs.    Vag closed thick  A R sided pain abd and back     36 47 week IUP     Breech     fhts reassuring P bicitra and percocet given, IV bolus LR 500 ml, k pad to R side. CMP, amylase, lipase, f/o BPP scheduled, review of    pt status with Dr. Estanislado Pandy per telephone.    Lavera Guise, CNM      Addendum: CMP wnl, lipase 26, amylase 122, repeat BBP 8/8, sitting up in bed, eye contact, talkative, states feels ready to go home with medications. Discussed flexeril and percocet RXs, s/s labor and pain to report, f/o office as scheduled on Wed.Collaboration with Dr. Estanislado Pandy per telephone. Lavera Guise, CNM at [phone]

## 2011-06-20 NOTE — Progress Notes (Signed)
Pt up to BR. Ambulates well

## 2011-06-20 NOTE — Progress Notes (Signed)
States hurting so bad in back and to R side, feels better like this O VSS     Fhts 130s LTV mod accels     UC qq2 to irregular     Leaning over bedside A 36 47 week IUP     R sided pain P Had flexeril at ~1200 may have percocet now, encouraged to try 20 minutes in tub now,      Discussed heat, cold.     Lavera Guise, CNM

## 2011-06-20 NOTE — Progress Notes (Signed)
Eustace Pen CNM notified of pt's ctx pattern with variables and couple lates. Aware of pt's pain level of 10. Will see pt

## 2011-06-26 ENCOUNTER — Encounter (HOSPITAL_COMMUNITY): Payer: Self-pay | Admitting: Pharmacist

## 2011-06-30 ENCOUNTER — Encounter (HOSPITAL_COMMUNITY)
Admission: RE | Admit: 2011-06-30 | Discharge: 2011-06-30 | Disposition: A | Discharge status: Home / Self Care | Payer: Medicaid Other | Source: Ambulatory Visit | Attending: Obstetrics and Gynecology | Admitting: Obstetrics and Gynecology

## 2011-06-30 ENCOUNTER — Encounter (HOSPITAL_COMMUNITY): Payer: Self-pay

## 2011-06-30 LAB — SURGICAL PCR SCREEN: MRSA, PCR: NEGATIVE

## 2011-06-30 LAB — CBC
Hemoglobin: 12.8 g/dL (ref 12.0–15.0)
MCH: 33.1 pg (ref 26.0–34.0)
Platelets: 348 10*3/uL (ref 150–400)
RBC: 3.87 MIL/uL (ref 3.87–5.11)
WBC: 7.8 10*3/uL (ref 4.0–10.5)

## 2011-06-30 LAB — RPR: RPR Ser Ql: NONREACTIVE

## 2011-06-30 NOTE — Patient Instructions (Signed)
YOUR PROCEDURE IS SCHEDULED ON:07/01/11  ENTER THROUGH THE MAIN ENTRANCE OF [facility] AT:0930 am  USE DESK PHONE AND DIAL [id number] TO INFORM us OF YOUR ARRIVAL  CALL [phone] IF YOU HAVE ANY QUESTIONS OR PROBLEMS PRIOR TO YOUR ARRIVAL.  REMEMBER: DO NOT EAT OR DRINK AFTER MIDNIGHT :tonight  SPECIAL INSTRUCTIONS:   YOU MAY BRUSH YOUR TEETH THE MORNING OF SURGERY   TAKE THESE MEDICINES THE DAY OF SURGERY WITH SIP OF WATER:none  DO NOT WEAR JEWELRY, EYE MAKEUP, LIPSTICK OR DARK FINGERNAIL POLISH DO NOT WEAR LOTIONS DO NOT SHAVE FOR 48 HOURS PRIOR TO SURGERY  YOU WILL NOT BE ALLOWED TO DRIVE YOURSELF HOME.  NAME OF DRIVER:parent- Mahlon Gammon- [phone]

## 2011-07-01 ENCOUNTER — Encounter (HOSPITAL_COMMUNITY): Payer: Self-pay | Admitting: Surgery

## 2011-07-01 ENCOUNTER — Encounter (HOSPITAL_COMMUNITY): Payer: Self-pay | Admitting: Anesthesiology

## 2011-07-01 ENCOUNTER — Inpatient Hospital Stay (HOSPITAL_COMMUNITY)
Admission: RE | Admit: 2011-07-01 | Discharge: 2011-07-04 | DRG: 766 | Disposition: A | Discharge status: Home / Self Care | Payer: Medicaid Other | Source: Ambulatory Visit | Attending: Obstetrics and Gynecology | Admitting: Obstetrics and Gynecology

## 2011-07-01 ENCOUNTER — Inpatient Hospital Stay (HOSPITAL_COMMUNITY): Payer: Medicaid Other | Admitting: Anesthesiology

## 2011-07-01 ENCOUNTER — Encounter (HOSPITAL_COMMUNITY): Payer: Self-pay | Admitting: *Deleted

## 2011-07-01 ENCOUNTER — Encounter (HOSPITAL_COMMUNITY)
Admission: RE | Disposition: A | Discharge status: Home / Self Care | Payer: Self-pay | Source: Ambulatory Visit | Attending: Obstetrics and Gynecology

## 2011-07-01 ENCOUNTER — Other Ambulatory Visit: Payer: Self-pay | Admitting: Obstetrics and Gynecology

## 2011-07-01 DIAGNOSIS — O34599 Maternal care for other abnormalities of gravid uterus, unspecified trimester: Secondary | ICD-10-CM | POA: Diagnosis present

## 2011-07-01 DIAGNOSIS — O99892 Other specified diseases and conditions complicating childbirth: Secondary | ICD-10-CM | POA: Diagnosis present

## 2011-07-01 DIAGNOSIS — O321XX0 Maternal care for breech presentation, not applicable or unspecified: Principal | ICD-10-CM | POA: Diagnosis present

## 2011-07-01 DIAGNOSIS — D4959 Neoplasm of unspecified behavior of other genitourinary organ: Secondary | ICD-10-CM | POA: Diagnosis present

## 2011-07-01 DIAGNOSIS — D252 Subserosal leiomyoma of uterus: Secondary | ICD-10-CM | POA: Diagnosis present

## 2011-07-01 DIAGNOSIS — Z22330 Carrier of Group B streptococcus: Secondary | ICD-10-CM

## 2011-07-01 SURGERY — Surgical Case
Anesthesia: Spinal | Site: Abdomen | Wound class: Clean Contaminated

## 2011-07-01 MED ORDER — METOCLOPRAMIDE HCL 5 MG/ML IJ SOLN: 10.0000 mg | Freq: Three times a day (TID) | INTRAMUSCULAR | Status: DC | PRN

## 2011-07-01 MED ORDER — OXYTOCIN 20 UNITS IN LACTATED RINGERS INFUSION - SIMPLE
INTRAVENOUS | Status: DC | PRN
  Administered 2011-07-01 (×2): 20 [IU] via INTRAVENOUS

## 2011-07-01 MED ORDER — MORPHINE SULFATE 0.5 MG/ML IJ SOLN
INTRAMUSCULAR | Status: AC
  Filled 2011-07-01: qty 10

## 2011-07-01 MED ORDER — ONDANSETRON HCL 4 MG PO TABS: 4.0000 mg | ORAL_TABLET | ORAL | Status: DC | PRN

## 2011-07-01 MED ORDER — METHYLERGONOVINE MALEATE 0.2 MG/ML IJ SOLN: 0.2000 mg | INTRAMUSCULAR | Status: DC | PRN

## 2011-07-01 MED ORDER — CEFAZOLIN SODIUM 1-5 GM-% IV SOLN
1.0000 g | INTRAVENOUS | Status: AC
  Administered 2011-07-01: 1 g via INTRAVENOUS

## 2011-07-01 MED ORDER — SCOPOLAMINE 1 MG/3DAYS TD PT72
1.0000 | MEDICATED_PATCH | Freq: Once | TRANSDERMAL | Status: DC
  Administered 2011-07-01: 1.5 mg via TRANSDERMAL

## 2011-07-01 MED ORDER — NALBUPHINE HCL 10 MG/ML IJ SOLN: 5.0000 mg | INTRAMUSCULAR | Status: DC | PRN

## 2011-07-01 MED ORDER — IBUPROFEN 600 MG PO TABS: 600.0000 mg | ORAL_TABLET | Freq: Four times a day (QID) | ORAL | Status: DC | PRN

## 2011-07-01 MED ORDER — BUPIVACAINE HCL (PF) 0.25 % IJ SOLN
INTRAMUSCULAR | Status: DC | PRN
  Administered 2011-07-01: 20 mL

## 2011-07-01 MED ORDER — OXYTOCIN 20 UNITS IN LACTATED RINGERS INFUSION - SIMPLE: 125.0000 mL/h | INTRAVENOUS | Status: AC

## 2011-07-01 MED ORDER — PHENYLEPHRINE 40 MCG/ML (10ML) SYRINGE FOR IV PUSH (FOR BLOOD PRESSURE SUPPORT)
PREFILLED_SYRINGE | INTRAVENOUS | Status: AC
  Filled 2011-07-01: qty 5

## 2011-07-01 MED ORDER — KETOROLAC TROMETHAMINE 30 MG/ML IJ SOLN: 30.0000 mg | Freq: Four times a day (QID) | INTRAMUSCULAR | Status: AC | PRN

## 2011-07-01 MED ORDER — SENNOSIDES-DOCUSATE SODIUM 8.6-50 MG PO TABS
2.0000 | ORAL_TABLET | Freq: Every day | ORAL | Status: DC
  Administered 2011-07-02 – 2011-07-03 (×2): 2 via ORAL

## 2011-07-01 MED ORDER — FERROUS SULFATE 325 (65 FE) MG PO TABS
325.0000 mg | ORAL_TABLET | Freq: Two times a day (BID) | ORAL | Status: DC
  Administered 2011-07-02 – 2011-07-04 (×5): 325 mg via ORAL
  Filled 2011-07-01 (×5): qty 1

## 2011-07-01 MED ORDER — MUPIROCIN 2 % EX OINT
TOPICAL_OINTMENT | Freq: Two times a day (BID) | CUTANEOUS | Status: DC
  Administered 2011-07-01: 10:00:00 via NASAL

## 2011-07-01 MED ORDER — MUPIROCIN 2 % EX OINT
1.0000 "application " | TOPICAL_OINTMENT | Freq: Two times a day (BID) | CUTANEOUS | Status: DC
  Administered 2011-07-02 – 2011-07-04 (×5): 1 via NASAL
  Filled 2011-07-01 (×2): qty 22

## 2011-07-01 MED ORDER — MENTHOL 3 MG MT LOZG: 1.0000 | LOZENGE | OROMUCOSAL | Status: DC | PRN

## 2011-07-01 MED ORDER — SIMETHICONE 80 MG PO CHEW: 80.0000 mg | CHEWABLE_TABLET | ORAL | Status: DC | PRN

## 2011-07-01 MED ORDER — SIMETHICONE 80 MG PO CHEW
80.0000 mg | CHEWABLE_TABLET | Freq: Three times a day (TID) | ORAL | Status: DC
  Administered 2011-07-01 – 2011-07-04 (×9): 80 mg via ORAL

## 2011-07-01 MED ORDER — CHLORHEXIDINE GLUCONATE CLOTH 2 % EX PADS
6.0000 | MEDICATED_PAD | Freq: Every day | CUTANEOUS | Status: DC
  Administered 2011-07-02 – 2011-07-03 (×2): 6 via TOPICAL

## 2011-07-01 MED ORDER — MEPERIDINE HCL 25 MG/ML IJ SOLN: 6.2500 mg | INTRAMUSCULAR | Status: DC | PRN

## 2011-07-01 MED ORDER — LACTATED RINGERS IV SOLN
INTRAVENOUS | Status: DC
  Administered 2011-07-01 (×3): via INTRAVENOUS

## 2011-07-01 MED ORDER — WITCH HAZEL-GLYCERIN EX PADS: 1.0000 "application " | MEDICATED_PAD | CUTANEOUS | Status: DC | PRN

## 2011-07-01 MED ORDER — SODIUM CHLORIDE 0.9 % IV SOLN: 1.0000 ug/kg/h | INTRAVENOUS | Status: DC | PRN

## 2011-07-01 MED ORDER — CEFAZOLIN SODIUM 1-5 GM-% IV SOLN
INTRAVENOUS | Status: AC
  Filled 2011-07-01: qty 50

## 2011-07-01 MED ORDER — KETOROLAC TROMETHAMINE 60 MG/2ML IM SOLN
INTRAMUSCULAR | Status: AC
  Administered 2011-07-01: 60 mg via INTRAMUSCULAR
  Filled 2011-07-01: qty 2

## 2011-07-01 MED ORDER — SODIUM CHLORIDE 0.9 % IJ SOLN: 3.0000 mL | INTRAMUSCULAR | Status: DC | PRN

## 2011-07-01 MED ORDER — DIPHENHYDRAMINE HCL 50 MG/ML IJ SOLN: 25.0000 mg | INTRAMUSCULAR | Status: DC | PRN

## 2011-07-01 MED ORDER — ONDANSETRON HCL 4 MG/2ML IJ SOLN: 4.0000 mg | INTRAMUSCULAR | Status: DC | PRN

## 2011-07-01 MED ORDER — FENTANYL CITRATE 0.05 MG/ML IJ SOLN: 25.0000 ug | INTRAMUSCULAR | Status: DC | PRN

## 2011-07-01 MED ORDER — KETOROLAC TROMETHAMINE 60 MG/2ML IM SOLN
60.0000 mg | Freq: Once | INTRAMUSCULAR | Status: AC | PRN
  Administered 2011-07-01: 60 mg via INTRAMUSCULAR

## 2011-07-01 MED ORDER — OXYCODONE-ACETAMINOPHEN 5-325 MG PO TABS
1.0000 | ORAL_TABLET | ORAL | Status: DC | PRN
  Administered 2011-07-02 (×3): 2 via ORAL
  Administered 2011-07-03 – 2011-07-04 (×4): 1 via ORAL
  Filled 2011-07-01: qty 2
  Filled 2011-07-01: qty 1
  Filled 2011-07-01: qty 2
  Filled 2011-07-01 (×2): qty 1
  Filled 2011-07-01: qty 2
  Filled 2011-07-01: qty 1

## 2011-07-01 MED ORDER — NALOXONE HCL 0.4 MG/ML IJ SOLN: 0.4000 mg | INTRAMUSCULAR | Status: DC | PRN

## 2011-07-01 MED ORDER — PHENYLEPHRINE HCL 10 MG/ML IJ SOLN
INTRAMUSCULAR | Status: DC | PRN
  Administered 2011-07-01 (×2): 40 ug via INTRAVENOUS
  Administered 2011-07-01: 80 ug via INTRAVENOUS
  Administered 2011-07-01 (×2): 40 ug via INTRAVENOUS

## 2011-07-01 MED ORDER — MUPIROCIN 2 % EX OINT
TOPICAL_OINTMENT | CUTANEOUS | Status: AC
  Filled 2011-07-01: qty 22

## 2011-07-01 MED ORDER — ONDANSETRON HCL 4 MG/2ML IJ SOLN
INTRAMUSCULAR | Status: AC
  Filled 2011-07-01: qty 2

## 2011-07-01 MED ORDER — LACTATED RINGERS IV SOLN
INTRAVENOUS | Status: DC
  Administered 2011-07-01: 22:00:00 via INTRAVENOUS

## 2011-07-01 MED ORDER — SCOPOLAMINE 1 MG/3DAYS TD PT72
1.0000 | MEDICATED_PATCH | Freq: Once | TRANSDERMAL | Status: DC
  Filled 2011-07-01: qty 1

## 2011-07-01 MED ORDER — EPHEDRINE SULFATE 50 MG/ML IJ SOLN
INTRAMUSCULAR | Status: DC | PRN
  Administered 2011-07-01: 10 mg via INTRAVENOUS
  Administered 2011-07-01: 5 mg via INTRAVENOUS
  Administered 2011-07-01: 10 mg via INTRAVENOUS

## 2011-07-01 MED ORDER — EPHEDRINE 5 MG/ML INJ
INTRAVENOUS | Status: AC
  Filled 2011-07-01: qty 10

## 2011-07-01 MED ORDER — IBUPROFEN 600 MG PO TABS
600.0000 mg | ORAL_TABLET | Freq: Four times a day (QID) | ORAL | Status: DC
  Administered 2011-07-01 – 2011-07-04 (×10): 600 mg via ORAL
  Filled 2011-07-01 (×12): qty 1

## 2011-07-01 MED ORDER — SCOPOLAMINE 1 MG/3DAYS TD PT72
MEDICATED_PATCH | TRANSDERMAL | Status: AC
  Administered 2011-07-01: 1.5 mg via TRANSDERMAL
  Filled 2011-07-01: qty 1

## 2011-07-01 MED ORDER — DIPHENHYDRAMINE HCL 25 MG PO CAPS: 25.0000 mg | ORAL_CAPSULE | Freq: Four times a day (QID) | ORAL | Status: DC | PRN

## 2011-07-01 MED ORDER — LANOLIN HYDROUS EX OINT: 1.0000 "application " | TOPICAL_OINTMENT | CUTANEOUS | Status: DC | PRN

## 2011-07-01 MED ORDER — FENTANYL CITRATE 0.05 MG/ML IJ SOLN
INTRAMUSCULAR | Status: AC
  Filled 2011-07-01: qty 2

## 2011-07-01 MED ORDER — FENTANYL CITRATE 0.05 MG/ML IJ SOLN
INTRAMUSCULAR | Status: DC | PRN
  Administered 2011-07-01: 25 ug via INTRATHECAL
  Administered 2011-07-01: 75 ug via INTRAVENOUS

## 2011-07-01 MED ORDER — ZOLPIDEM TARTRATE 5 MG PO TABS: 5.0000 mg | ORAL_TABLET | Freq: Every evening | ORAL | Status: DC | PRN

## 2011-07-01 MED ORDER — OXYTOCIN 10 UNIT/ML IJ SOLN
INTRAMUSCULAR | Status: AC
  Filled 2011-07-01: qty 4

## 2011-07-01 MED ORDER — MORPHINE SULFATE (PF) 0.5 MG/ML IJ SOLN
INTRAMUSCULAR | Status: DC | PRN
  Administered 2011-07-01: .15 mg via INTRATHECAL

## 2011-07-01 MED ORDER — METHYLERGONOVINE MALEATE 0.2 MG PO TABS: 0.2000 mg | ORAL_TABLET | ORAL | Status: DC | PRN

## 2011-07-01 MED ORDER — DIPHENHYDRAMINE HCL 50 MG/ML IJ SOLN: 12.5000 mg | INTRAMUSCULAR | Status: DC | PRN

## 2011-07-01 MED ORDER — PRENATAL MULTIVITAMIN CH
1.0000 | ORAL_TABLET | Freq: Every day | ORAL | Status: DC
  Administered 2011-07-02 – 2011-07-04 (×3): 1 via ORAL
  Filled 2011-07-01 (×2): qty 1

## 2011-07-01 MED ORDER — DIPHENHYDRAMINE HCL 25 MG PO CAPS: 25.0000 mg | ORAL_CAPSULE | ORAL | Status: DC | PRN

## 2011-07-01 MED ORDER — BUPIVACAINE HCL (PF) 0.25 % IJ SOLN
INTRAMUSCULAR | Status: AC
  Filled 2011-07-01: qty 30

## 2011-07-01 MED ORDER — TETANUS-DIPHTH-ACELL PERTUSSIS 5-2.5-18.5 LF-MCG/0.5 IM SUSP: 0.5000 mL | Freq: Once | INTRAMUSCULAR | Status: DC

## 2011-07-01 MED ORDER — ONDANSETRON HCL 4 MG/2ML IJ SOLN
INTRAMUSCULAR | Status: DC | PRN
  Administered 2011-07-01: 4 mg via INTRAVENOUS

## 2011-07-01 MED ORDER — PRENATAL MULTIVITAMIN CH: 1.0000 | ORAL_TABLET | Freq: Every day | ORAL | Status: DC

## 2011-07-01 MED ORDER — ONDANSETRON HCL 4 MG/2ML IJ SOLN: 4.0000 mg | Freq: Three times a day (TID) | INTRAMUSCULAR | Status: DC | PRN

## 2011-07-01 MED ORDER — DIBUCAINE 1 % RE OINT: 1.0000 "application " | TOPICAL_OINTMENT | RECTAL | Status: DC | PRN

## 2011-07-01 SURGICAL SUPPLY — 35 items
BENZOIN TINCTURE PRP APPL 2/3 (GAUZE/BANDAGES/DRESSINGS) ×2 IMPLANT
BOOTIES KNEE HIGH SLOAN (MISCELLANEOUS) ×4 IMPLANT
CHLORAPREP W/TINT 26ML (MISCELLANEOUS) ×2 IMPLANT
CLOTH BEACON ORANGE TIMEOUT ST (SAFETY) ×2 IMPLANT
DRAIN JACKSON PRT FLT 10 (DRAIN) IMPLANT
DRESSING TELFA 8X3 (GAUZE/BANDAGES/DRESSINGS) ×2 IMPLANT
ELECT REM PT RETURN 9FT ADLT (ELECTROSURGICAL) ×2
ELECTRODE REM PT RTRN 9FT ADLT (ELECTROSURGICAL) ×1 IMPLANT
EVACUATOR SILICONE 100CC (DRAIN) IMPLANT
EXTRACTOR VACUUM M CUP 4 TUBE (SUCTIONS) IMPLANT
GAUZE SPONGE 4X4 12PLY STRL LF (GAUZE/BANDAGES/DRESSINGS) ×4 IMPLANT
GLOVE BIOGEL PI IND STRL 7.0 (GLOVE) ×3 IMPLANT
GLOVE BIOGEL PI INDICATOR 7.0 (GLOVE) ×3
GLOVE ECLIPSE 6.5 STRL STRAW (GLOVE) ×2 IMPLANT
GOWN PREVENTION PLUS LG XLONG (DISPOSABLE) ×6 IMPLANT
KIT ABG SYR 3ML LUER SLIP (SYRINGE) IMPLANT
NEEDLE HYPO 22GX1.5 SAFETY (NEEDLE) ×2 IMPLANT
NEEDLE HYPO 25X5/8 SAFETYGLIDE (NEEDLE) IMPLANT
NS IRRIG 1000ML POUR BTL (IV SOLUTION) ×4 IMPLANT
PACK C SECTION WH (CUSTOM PROCEDURE TRAY) ×2 IMPLANT
PAD ABD 7.5X8 STRL (GAUZE/BANDAGES/DRESSINGS) ×2 IMPLANT
PAD ABD DERMACEA PRESS 5X9 (GAUZE/BANDAGES/DRESSINGS) ×2 IMPLANT
RTRCTR C-SECT PINK 25CM LRG (MISCELLANEOUS) ×2 IMPLANT
SLEEVE SCD COMPRESS KNEE MED (MISCELLANEOUS) ×2 IMPLANT
STRIP CLOSURE SKIN 1/2X4 (GAUZE/BANDAGES/DRESSINGS) ×2 IMPLANT
SUT CHROMIC GUT AB #0 18 (SUTURE) IMPLANT
SUT MNCRL AB 3-0 PS2 27 (SUTURE) ×2 IMPLANT
SUT SILK 2 0 FSL 18 (SUTURE) IMPLANT
SUT VIC AB 0 CTX 36 (SUTURE) ×2
SUT VIC AB 0 CTX36XBRD ANBCTRL (SUTURE) ×2 IMPLANT
SUT VIC AB 1 CT1 36 (SUTURE) ×4 IMPLANT
SYR 20CC LL (SYRINGE) ×2 IMPLANT
TOWEL OR 17X24 6PK STRL BLUE (TOWEL DISPOSABLE) ×4 IMPLANT
TRAY FOLEY CATH 14FR (SET/KITS/TRAYS/PACK) ×2 IMPLANT
WATER STERILE IRR 1000ML POUR (IV SOLUTION) ×2 IMPLANT

## 2011-07-01 NOTE — Anesthesia Preprocedure Evaluation (Signed)
Anesthesia Evaluation  Patient identified by MRN, date of birth, ID band Patient awake    Reviewed: Allergy & Precautions, H&P , Patient's Chart, lab work & pertinent test results  Airway Mallampati: II TM Distance: >3 FB Neck ROM: Full    Dental No notable dental hx. (+) Teeth Intact   Pulmonary neg pulmonary ROS,  clear to auscultation  Pulmonary exam normal       Cardiovascular neg cardio ROS Regular Normal    Neuro/Psych Negative Neurological ROS  Negative Psych ROS   GI/Hepatic negative GI ROS, Neg liver ROS,   Endo/Other  Negative Endocrine ROS  Renal/GU negative Renal ROS  Genitourinary negative   Musculoskeletal   Abdominal Normal abdominal exam  (+)   Peds  Hematology negative hematology ROS (+)   Anesthesia Other Findings   Reproductive/Obstetrics (+) Pregnancy                          Anesthesia Physical Anesthesia Plan  ASA: II  Anesthesia Plan: Spinal   Post-op Pain Management:    Induction:   Airway Management Planned:   Additional Equipment:   Intra-op Plan:   Post-operative Plan:   Informed Consent: I have reviewed the patients History and Physical, chart, labs and discussed the procedure including the risks, benefits and alternatives for the proposed anesthesia with the patient or authorized representative who has indicated his/her understanding and acceptance.     Plan Discussed with: Anesthesiologist, CRNA and Surgeon  Anesthesia Plan Comments:        Anesthesia Quick Evaluation  

## 2011-07-01 NOTE — H&P (Signed)
Rita Long is a 21 y.o. female  G3P0 EAB x 2, LMP uncertain, EDC 1/29 per 10 2/7 week [place]. Denies srom, vag bleeding, with +FM, presenting for primary cesarean section for breech presentation, worsening back pain thought to be secondary to uterine fibroid and short stature, requiring continuous use of Narcotics to partially improve, ambien and flexeril. Pg significant for: Ongoing back pain requiring medication breech History OB History    Grav Para Term Preterm Abortions TAB SAB Ect Mult Living   3 0 0 0 2 2 0 0 0 0      Past Medical History  Diagnosis Date  . Fibroid   . No pertinent past medical history    Past Surgical History  Procedure Date  . No past surgeries    Family History: family history includes Hypertension in her mother.  There is no history of Anesthesia problems, and Hypotension, and Malignant hyperthermia, and Pseudochol deficiency, . Social History:  reports that she quit smoking about a year ago. She has never used smokeless tobacco. She reports that she does not drink alcohol or use illicit drugs.  ROS BP 109/72  Pulse 90  Temp(Src) 97.3 F (36.3 C) (Oral)  Resp 20  SpO2 99%   Blood pressure 109/72, pulse 90, temperature 97.3 F (36.3 C), temperature source Oral, resp. rate 20, SpO2 99.00%. Exam Physical Exam Lungs clear bilaterally on auscultation, AP regular, abdomin soft, gravid, nt, bowel sounds active, breech per bedside US, +1 edema to lower legs bilaterally Prenatal labs: ABO, Rh:  O positive Antibody: Negative (07/05 0000) Rubella: Immune (07/05 0000) RPR: NON REACTIVE (01/15 1410)  HBsAg: Negative (07/05 0000)  HIV: Non-reactive (07/05 0000)  GBS:   positive  Assessment/Plan: 38 1/7 week IUP Breech presentatio Plan primary cesarean section, verbalized understanding of risks and benefits. Collaboration with Dr. Brantley Fling, [facility] 07/01/2011, 10:17 AM

## 2011-07-01 NOTE — Anesthesia Procedure Notes (Signed)

## 2011-07-01 NOTE — Brief Op Note (Signed)
07/01/2011  11:31 AM  PATIENT:  Rita Long  21 y.o. female  PRE-OPERATIVE DIAGNOSIS:  Breech Presentation; 38.1 weeks, fibroids, severe back pain  POST-OPERATIVE DIAGNOSIS:  same    Procedure(s): CESAREAN SECTION and myomectomy    Surgeon(s): Esmeralda Arthur, MD   ASSISTANTS: Lavera Guise CNM   ANESTHESIA:   local and spinal  LOCAL MEDICATIONS USED:  MARCAINE 20 CC  SPECIMEN:  placenta, fibroid  DISPOSITION OF SPECIMEN:  L&D, pathology  COUNTS:  YES  ESTIMATED BLOOD LOSS:  600 cc  PATIENT DISPOSITION:  PACU - hemodynamically stable.      [facility] AMD 07/01/2011 11:31 AM

## 2011-07-01 NOTE — Interval H&P Note (Signed)
History and Physical Interval Note:  07/01/2011 11:17 AM  Rita Long  has presented today for surgery, with the diagnosis of Breech Presentation IUPat 39 2/7 weeks  The various methods of treatment have been discussed with the patient and family. After consideration of risks, benefits and other options for treatment, the patient has consented to  Procedure(s): CESAREAN SECTION as a surgical intervention .  The patients' history has been reviewed, patient examined, no change in status, stable for surgery.  I have reviewed the patients' chart and labs.  Questions were answered to the patient's satisfaction.     Caston Coopersmith A

## 2011-07-01 NOTE — Transfer of Care (Signed)
Immediate Anesthesia Transfer of Care Note  Patient: Rita Long  Procedure(s) Performed:  CESAREAN SECTION  Patient Location: PACU  Anesthesia Type: Spinal  Level of Consciousness: awake, alert  and oriented  Airway & Oxygen Therapy: Patient Spontanous Breathing  Post-op Assessment: Report given to PACU RN and Post -op Vital signs reviewed and stable  Post vital signs: Reviewed and stable Filed Vitals:   07/01/11 1256  BP:   Pulse:   Temp: 36.4 C  Resp:     Complications: No apparent anesthesia complications

## 2011-07-01 NOTE — Op Note (Signed)
Preoperative diagnosis: Intrauterine pregnancy at 38 weeks and 1 days,breech, with severe back pain, fibroid  Post operative diagnosis: Same  Anesthesia: Spinal  Anesthesiologist: Dr. Cristela Blue you and you  Procedure: Primary low transverse cesarean section and my aomectomy  Surgeon: Dr. Dois Davenport Kerrilyn Azbill  Assistant: Lavera Guise CNM  Estimated blood loss: 600 a him in in a and and and cc  Procedure:  After being informed of the planned procedure and possible complications including bleeding, infection, injury to other organs, informed consent is obtained. The patient is taken to OR #9 and given spinal anesthesia without complication. She is placed in the dorsal decubitus position with the pelvis tilted to the left. She is then prepped and draped in a sterile fashion. A Foley catheter is inserted in her bladder.  After assessing adequate level of anesthesia, we infiltrate the suprapubic area with 20 cc of Marcaine 0.25 and perform a Pfannenstiel incision which is brought down sharply to the fascia. The fascia is entered in a low transverse fashion. Linea alba is dissected. Peritoneum is entered in a midline fashion. An Alexis retractor is easily positioned. Visceral peritoneum is entered in a low transverse fashion allowing [place] to safely retract bladder by developing a bladder flap.  The myometrium is then entered in a low transverse fashion; first with knife and then extended bluntly. Amniotic fluid is clear. We assist the birth of a female  infant in breech presentation. Mouth and nose are suctioned. The baby is delivered. The cord is clamped and sectioned. The baby is given to the neonatologist present in the room.  10 cc of blood is drawn from the umbilical vein.The placenta is allowed to deliver spontaneously. It is complete and the cord has 3 vessels. Uterine revision is negative.  We proceed with closure of the myometrium in 2 layers: First with a running locked suture of 0 Vicryl,  then with a Lembert suture of 0 Vicryl imbricating the first one. Hemostasis is completed with cauterization on peritoneal edges.  A small sub-serosal fibroid and and and and and and awas removed from the left posterior cornua. The defect was repaired with a running locked suture of 0 Vicryl. Hemostasis was adequate.  Both paracolic gutters are cleaned. Both tubes and ovaries are assessed and normal. The pelvis is profusely irrigated with warm saline to confirm a satisfactory hemostasis.  Retractors and sponges are removed. Under fascia hemostasis is completed with cauterization. The fascia is then closed with 2 running sutures of 0 Vicryl meeting midline. The wound is irrigated with warm saline and hemostasis is completed with cauterization. The skin is closed with a subcuticular suture of 3-0 Monocryl and Steri-Strips.  Instrument and sponge count is complete x2. Estimated blood loss is and and and  Him in in in in a you600 cc.  The procedure is well tolerated by the patient who is taken to recovery room in a well and stable condition.  female baby named Anette Riedel was born at 12:14 and and and received an Apgar of 6  at 1 minute and 9 at 5 minutes. Weight was 6 lbs  1 oz.    Specimen: Placenta sent to L & D                    Fibroid to pathology.   [facility] A MD 1/16/201311:35 AM

## 2011-07-01 NOTE — OR Nursing (Signed)
Skin to Skin initiated in OR. Nursery Nurse present

## 2011-07-01 NOTE — Anesthesia Postprocedure Evaluation (Signed)
  Anesthesia Post-op Note  Patient: Rita Long  Procedure(s) Performed:  CESAREAN SECTION  Patient is awake, responsive, moving her legs, and has signs of resolution of her numbness. Pain and nausea are reasonably well controlled. Vital signs are stable and clinically acceptable. Oxygen saturation is clinically acceptable. There are no apparent anesthetic complications at this time. Patient is ready for discharge.

## 2011-07-02 ENCOUNTER — Encounter (HOSPITAL_COMMUNITY): Payer: Self-pay | Admitting: Obstetrics and Gynecology

## 2011-07-02 LAB — CBC
MCH: 32.4 pg (ref 26.0–34.0)
MCHC: 33.4 g/dL (ref 30.0–36.0)
Platelets: 315 10*3/uL (ref 150–400)
RDW: 13.1 % (ref 11.5–15.5)

## 2011-07-02 NOTE — Progress Notes (Signed)
PSYCHOSOCIAL ASSESSMENT ~ MATERNAL/CHILD Name: Rita Long                                                                                          Age: 23 day   Referral Date: 07/02/11 Reason/Source: NICU Support/NICU  I. FAMILY/HOME ENVIRONMENT A. Child's Legal Guardian _x__Parent(s) ___Grandparent ___Foster parent ___DSS_________________ Name: Rita Long                            DOB: May 08, 1991            Age: 5   Address: [address] [place], [place] [address]  Name: Rita Long.                              DOB: //                     Age:   Address: Parents are no longer together  B. Other Household Members/Support Persons Name:                                         Relationship:                        DOB ___/___/___                   Name:                                         Relationship:                        DOB ___/___/___                   Name:                                         Relationship:                        DOB ___/___/___                   Name:                                         Relationship:                        DOB ___/___/___  C. Other Support: MOB reports having a good support system.     II. PSYCHOSOCIAL DATA A. Information Source  _x_Patient Interview  __Family Interview           _x_Other: chart  B. Event organiser __Employment: _x_Medicaid    [place]:                  __Private Insurance:                   __Self Pay  __Food Stamps   __WIC __[employer]     __Public Housing     __Section 8    __Maternity Care Coordination/Child Service Coordination/Early Intervention  __School:                                                                         Grade:  __Other:   Priscille Kluver and Environment Information Cultural Issues Impacting Care: MOB reports issues with FOB and his  family  III. STRENGTHS _x__Supportive family/friends _x__Adequate Resources _x__Compliance with medical plan _x__Home prepared for Child (including basic supplies) _x__Understanding of illness      _x__Other: MOB plans to take baby to Dr. Avis Epley at [facility] for follow up IV. RISK FACTORS AND CURRENT PROBLEMS         ____No Problems Noted                                                                                                                                                                                                                                                Pt              Family          Substance Abuse                                                                   ___              ___  Mental Illness                                                                        ___              ___  Family/Relationship Issues                                      _x__             _x__             Abuse/Neglect/Domestic Violence                                         ___         ___  Financial Resources                                        ___              ___             Transportation                                                                        ___               ___  DSS Involvement                                                                   ___              ___  Adjustment to Illness                                                               ___              ___  Knowledge/Cognitive Deficit                                                   ___              ___  Compliance with Treatment                                                 ___              ___  Basic Needs (food, housing, etc.)                                          ___              ___             Housing Concerns                                       ___              ___ Other_____________________________________________________________            V. SOCIAL WORK  ASSESSMENT SW met with MOB in her first floor room to introduce myself, complete assessment and evaluate how she is coping with baby's admission to the NICU.  MOB immediately started talking about FOB and not wanting to allow him to visit, and more importantly, not allow him to bring people with him to visit.  MOB did not want to expand on the details or her concerns at this time.  SW informed her of visitation policy and paternal rights if he is on the birth certificate.  MOB reports that she has not put him on the birth certificate at this point and although she is not denying he is the father, she wants to handle things after the baby is home from the hospital.  SW told her this would probably be best since baby is in intensive care and things are stressful enough.  SW informed her that FOB can be added to the birth certificate at a later date.  SW asked if MOB is fearful that someone may try to take the baby from the hospital and she said yes.  SW offered to put a hugs tag on the baby and MOB said she wanted this.  SW informed RN/Kayti H who placed a hugs tag on the baby.  MOB does not feel that it is necessary to be a security patient and feels better now with the hugs tag in place.  MOB reports having a great support system and everything she needs for baby at home.  She states that issues with FOB have been going on for a long time.  She seems to have a good understanding of the situation and states that although she would of course rather have him with her, she knows she can't do anything about the situation and knows he is getting the care and monitoring that he needs.  SW explained support services offered by NICU SWs and gave contact information.  MOB thanked SW.  VI. SOCIAL WORK PLAN  ___No Further Intervention Required/No Barriers to Discharge   __x_Psychosocial Support and Ongoing Assessment of Needs   ___Patient/Family Education:   ___Child Protective Services Report   County___________  Date___/____/____   ___Information/Referral to [employer]_________________________   ___Other:

## 2011-07-02 NOTE — Progress Notes (Signed)
UR Chart review completed.  

## 2011-07-02 NOTE — Progress Notes (Signed)
Subjective: Postpartum Day 1: Cesarean Delivery repeat Patient reports no problems voiding.  Feels much better s/p delivery--had significant pelvic pain in 3rd trimester, much improved since delivery.   Up ad lib, no syncope or dizziness.  Infant stable in NICU, had some tachypnea, but improving.  Only on IV fluids, no respiratory support needed.  Plans breastfeeding.  Tolerating regular diet without N/V.  Objective: Vital signs in last 24 hours: Temp:  [97.3 F (36.3 C)-98.3 F (36.8 C)] 97.5 F (36.4 C) (01/17 0526) Pulse Rate:  [64-95] 81  (01/17 0526) Resp:  [16-20] 18  (01/17 0526) BP: (107-128)/(49-77) 113/64 mmHg (01/17 0526) SpO2:  [97 %-100 %] 99 % (01/17 0500)  Results for orders placed during the hospital encounter of 07/01/11 (from the past 24 hour(s))  CBC     Status: Abnormal   Collection Time   07/02/11  8:31 AM      Component Value Range   WBC 8.8  4.0 - 10.5 (K/uL)   RBC 3.24 (*) 3.87 - 5.11 (MIL/uL)   Hemoglobin 10.5 (*) 12.0 - 15.0 (g/dL)   HCT 16.1 (*) 09.6 - 46.0 (%)   MCV 96.9  78.0 - 100.0 (fL)   MCH 32.4  26.0 - 34.0 (pg)   MCHC 33.4  30.0 - 36.0 (g/dL)   RDW 04.5  40.9 - 81.1 (%)   Platelets 315  150 - 400 (K/uL)    Physical Exam:  General: alert Lochia: appropriate Uterine Fundus: firm Incision: Dressing CDI DVT Evaluation: No evidence of DVT seen on physical exam. Negative Homan's sign. Upper abdominal tympanic distension--positive bowel sounds noted.   Basename 07/02/11 0831 06/30/11 1410  HGB 10.5* 12.8  HCT 31.4* 37.3    Assessment/Plan: Status post Cesarean section. Doing well postoperatively.  Continue current care. Encourage ambulation.  Serra Younan 07/02/2011, 11:01 AM

## 2011-07-03 ENCOUNTER — Other Ambulatory Visit (HOSPITAL_COMMUNITY): Payer: Medicaid Other

## 2011-07-03 NOTE — Progress Notes (Signed)
Post OP day 2 Subjective: up ad lib, voiding, tolerating PO and + flatus, comfortable with medication for pain, plans IUD  Objective: Blood pressure 107/74, pulse 85, temperature 98.2 F (36.8 C), temperature source Oral, resp. rate 18, SpO2 98.00%, unknown if currently breastfeeding.  Physical Exam:  General: alert, cooperative and no distress Lochia: appropriate Uterine Fundus: firm Incision: healing well DVT Evaluation: Negative Homan's sign. Calf/Ankle edema is present, +1 bitting lower legs. Lungs clear bilaterally on auscultation AP regular Bowel sounds active, abd soft, nontympanic   Basename 07/02/11 0831 06/30/11 1410  HGB 10.5* 12.8  HCT 31.4* 37.3    Assessment/Plan: Plan for discharge tomorrow Continue care.  LOS: 2 days   Rita Long 07/03/2011, 9:55 AM

## 2011-07-03 NOTE — Plan of Care (Signed)
Problem: Discharge Progression Outcomes Goal: Remove staples per MD order Outcome: Not Applicable Date Met:  07/03/11 Pt has sutures and steri strips ,no staples.

## 2011-07-04 MED ORDER — OXYCODONE-ACETAMINOPHEN 5-325 MG PO TABS: 1.0000 | ORAL_TABLET | ORAL | Status: DC | PRN

## 2011-07-04 MED ORDER — IBUPROFEN 600 MG PO TABS: 600.0000 mg | ORAL_TABLET | Freq: Four times a day (QID) | ORAL | Status: AC

## 2011-07-04 NOTE — Discharge Summary (Signed)
Physician Discharge Summary  Patient ID: Rita Long MRN: [id number] DOB/AGE: 02/28/1991 21 y.o.  Admit date: 07/01/2011 Discharge date: 07/04/2011  Admission Diagnoses:38 1/7 week IUP breech presentation  Discharge Diagnoses:  Principal Problem:  *Breech presentation Active Problems:  Back pain complicating pregnancy   Discharged Condition: stable  Hospital Course: primary c/s for breech with back pain requiring ongoing medications uncomplicated recovery.  Consults: none  Significant Diagnostic Studies: labs:   Treatments: IV hydration and antibiotics  Discharge Exam: Blood pressure 120/81, pulse 75, temperature 97.3 F (36.3 C), temperature source Oral, resp. rate 18, SpO2 98.00%, unknown if currently breastfeeding. General appearance: alert, cooperative and no distress lungs clear bilaterally, breasts firm, AP regular, abd soft, rounded, nt, FF sm serosa flow, incision well approximated no redness, edema or drainage, +1 edema to lower legs. States comfortable with po meds, had bm, breastfeeding well.  Disposition: Home or Self Care  Discharge Orders    Future Orders Please Complete By Expires   Diet - low sodium heart healthy      Discharge instructions      Comments:   F/o office 5 week appt, 6 week IUD   Activity as tolerated      Lifting restrictions      Comments:   Weight restriction of 10 lbs.   Driving restriction       Comments:   Avoid driving for at least 2 weeks.   Sexual acrtivity      Comments:   No intercourse for 6 weeks, not until after IUD placement     Medication List  As of 07/04/2011  8:51 AM   STOP taking these medications         zolpidem 10 MG tablet         TAKE these medications         cyclobenzaprine 10 MG tablet   Commonly known as: FLEXERIL   Take 10 mg by mouth 3 (three) times daily as needed. Side pain, cramps      ibuprofen 600 MG tablet   Commonly known as: ADVIL,MOTRIN   Take 1 tablet (600 mg total) by mouth  every 6 (six) hours.      oxyCODONE-acetaminophen 5-325 MG per tablet   Commonly known as: PERCOCET   Take 1 tablet by mouth every 4 (four) hours as needed. Pain, cramps      oxyCODONE-acetaminophen 5-325 MG per tablet   Commonly known as: PERCOCET   Take 1 tablet by mouth every 4 (four) hours as needed (moderate - severe pain).      prenatal multivitamin Tabs   Take 1 tablet by mouth daily.           Follow-up Information    Follow up with [facility] in 5 weeks.       plans IUD with f/o 6 weeks Signed: Lavera Guise 07/04/2011, 8:51 AM

## 2011-07-14 ENCOUNTER — Inpatient Hospital Stay (HOSPITAL_COMMUNITY)
Admission: AD | Admit: 2011-07-14 | Discharge: 2011-07-16 | DRG: 776 | Disposition: A | Discharge status: Home / Self Care | Payer: Medicaid Other | Source: Ambulatory Visit | Attending: Obstetrics and Gynecology | Admitting: Obstetrics and Gynecology

## 2011-07-14 ENCOUNTER — Inpatient Hospital Stay (HOSPITAL_COMMUNITY): Payer: Medicaid Other

## 2011-07-14 ENCOUNTER — Other Ambulatory Visit: Payer: Self-pay

## 2011-07-14 ENCOUNTER — Encounter (HOSPITAL_COMMUNITY): Payer: Self-pay | Admitting: *Deleted

## 2011-07-14 DIAGNOSIS — O135 Gestational [pregnancy-induced] hypertension without significant proteinuria, complicating the puerperium: Principal | ICD-10-CM | POA: Diagnosis present

## 2011-07-14 DIAGNOSIS — O165 Unspecified maternal hypertension, complicating the puerperium: Secondary | ICD-10-CM | POA: Diagnosis present

## 2011-07-14 DIAGNOSIS — R079 Chest pain, unspecified: Secondary | ICD-10-CM | POA: Diagnosis present

## 2011-07-14 LAB — MRSA PCR SCREENING: MRSA by PCR: NEGATIVE

## 2011-07-14 LAB — COMPREHENSIVE METABOLIC PANEL
ALT: 16 U/L (ref 0–35)
Alkaline Phosphatase: 66 U/L (ref 39–117)
BUN: 14 mg/dL (ref 6–23)
CO2: 26 mEq/L (ref 19–32)
GFR calc Af Amer: >90 mL/min (ref >90)
GFR calc non Af Amer: >90 mL/min (ref >90)
Glucose, Bld: 84 mg/dL (ref 70–99)
Potassium: 4 mEq/L (ref 3.5–5.1)
Sodium: 140 mEq/L (ref 135–145)

## 2011-07-14 LAB — URINALYSIS, ROUTINE W REFLEX MICROSCOPIC
Bilirubin Urine: NEGATIVE
Ketones, ur: NEGATIVE mg/dL
Protein, ur: NEGATIVE mg/dL
Urobilinogen, UA: 0.2 mg/dL (ref 0.0–1.0)

## 2011-07-14 LAB — DIFFERENTIAL
Eosinophils Relative: 1 % (ref 0–5)
Lymphocytes Relative: 26 % (ref 12–46)
Lymphs Abs: 1.5 10*3/uL (ref 0.7–4.0)
Monocytes Relative: 7 % (ref 3–12)
Neutrophils Relative %: 65 % (ref 43–77)

## 2011-07-14 LAB — CBC
Hemoglobin: 11.5 g/dL — ABNORMAL LOW (ref 12.0–15.0)
MCH: 31.2 pg (ref 26.0–34.0)
MCV: 95.7 fL (ref 78.0–100.0)
Platelets: 285 10*3/uL (ref 150–400)
RBC: 3.69 MIL/uL — ABNORMAL LOW (ref 3.87–5.11)
WBC: 5.7 10*3/uL (ref 4.0–10.5)

## 2011-07-14 LAB — CARDIAC PANEL(CRET KIN+CKTOT+MB+TROPI)
CK, MB: 2 ng/mL (ref 0.3–4.0)
Relative Index: 2 (ref 0.0–2.5)
Troponin I: <0.3 ng/mL (ref <0.30)

## 2011-07-14 LAB — URIC ACID: Uric Acid, Serum: 5.9 mg/dL (ref 2.4–7.0)

## 2011-07-14 LAB — URINE MICROSCOPIC-ADD ON

## 2011-07-14 MED ORDER — ONDANSETRON HCL 4 MG PO TABS: 4.0000 mg | ORAL_TABLET | ORAL | Status: DC | PRN

## 2011-07-14 MED ORDER — WITCH HAZEL-GLYCERIN EX PADS: 1.0000 "application " | MEDICATED_PAD | CUTANEOUS | Status: DC | PRN

## 2011-07-14 MED ORDER — OXYCODONE-ACETAMINOPHEN 5-325 MG PO TABS: 1.0000 | ORAL_TABLET | ORAL | Status: DC | PRN

## 2011-07-14 MED ORDER — HYDROMORPHONE HCL 2 MG PO TABS
2.0000 mg | ORAL_TABLET | ORAL | Status: DC | PRN
  Administered 2011-07-14 (×2): 2 mg via ORAL
  Filled 2011-07-14 (×2): qty 1

## 2011-07-14 MED ORDER — ONDANSETRON HCL 4 MG/2ML IJ SOLN: 4.0000 mg | INTRAMUSCULAR | Status: DC | PRN

## 2011-07-14 MED ORDER — DIBUCAINE 1 % RE OINT: 1.0000 "application " | TOPICAL_OINTMENT | RECTAL | Status: DC | PRN

## 2011-07-14 MED ORDER — TETANUS-DIPHTH-ACELL PERTUSSIS 5-2.5-18.5 LF-MCG/0.5 IM SUSP
0.5000 mL | Freq: Once | INTRAMUSCULAR | Status: DC
  Filled 2011-07-14: qty 0.5

## 2011-07-14 MED ORDER — MAGNESIUM SULFATE 40 G IN LACTATED RINGERS - SIMPLE: 2.0000 g/h | Freq: Once | INTRAVENOUS | Status: DC

## 2011-07-14 MED ORDER — HYDROMORPHONE HCL 2 MG PO TABS
2.0000 mg | ORAL_TABLET | ORAL | Status: DC | PRN
  Administered 2011-07-14: 4 mg via ORAL
  Administered 2011-07-15 (×2): 2 mg via ORAL
  Filled 2011-07-14: qty 2
  Filled 2011-07-14: qty 1
  Filled 2011-07-14: qty 2

## 2011-07-14 MED ORDER — LACTATED RINGERS IV SOLN
INTRAVENOUS | Status: DC
  Administered 2011-07-14 – 2011-07-15 (×3): via INTRAVENOUS

## 2011-07-14 MED ORDER — DIPHENHYDRAMINE HCL 25 MG PO CAPS: 25.0000 mg | ORAL_CAPSULE | Freq: Four times a day (QID) | ORAL | Status: DC | PRN

## 2011-07-14 MED ORDER — ZOLPIDEM TARTRATE 5 MG PO TABS: 5.0000 mg | ORAL_TABLET | Freq: Every evening | ORAL | Status: DC | PRN

## 2011-07-14 MED ORDER — SIMETHICONE 80 MG PO CHEW: 80.0000 mg | CHEWABLE_TABLET | ORAL | Status: DC | PRN

## 2011-07-14 MED ORDER — LANOLIN HYDROUS EX OINT: TOPICAL_OINTMENT | CUTANEOUS | Status: DC | PRN

## 2011-07-14 MED ORDER — IBUPROFEN 600 MG PO TABS
600.0000 mg | ORAL_TABLET | Freq: Four times a day (QID) | ORAL | Status: DC
  Administered 2011-07-14 – 2011-07-16 (×7): 600 mg via ORAL
  Filled 2011-07-14 (×7): qty 1

## 2011-07-14 MED ORDER — PRENATAL MULTIVITAMIN CH
1.0000 | ORAL_TABLET | Freq: Every day | ORAL | Status: DC
  Administered 2011-07-14 – 2011-07-16 (×3): 1 via ORAL
  Filled 2011-07-14 (×3): qty 1

## 2011-07-14 MED ORDER — GI COCKTAIL ~~LOC~~
30.0000 mL | Freq: Two times a day (BID) | ORAL | Status: DC | PRN
  Filled 2011-07-14: qty 30

## 2011-07-14 MED ORDER — BENZOCAINE-MENTHOL 20-0.5 % EX AERO: 1.0000 "application " | INHALATION_SPRAY | CUTANEOUS | Status: DC | PRN

## 2011-07-14 MED ORDER — MAGNESIUM SULFATE BOLUS VIA INFUSION
4.0000 g | Freq: Once | INTRAVENOUS | Status: DC
  Filled 2011-07-14: qty 500

## 2011-07-14 MED ORDER — SODIUM CHLORIDE 0.9 % IJ SOLN
INTRAMUSCULAR | Status: AC
  Filled 2011-07-14: qty 3

## 2011-07-14 MED ORDER — MAGNESIUM SULFATE 40 MG/ML IJ SOLN: 4.0000 g | Freq: Once | INTRAMUSCULAR | Status: DC

## 2011-07-14 MED ORDER — MAGNESIUM SULFATE 40 G IN LACTATED RINGERS - SIMPLE
2.0000 g/h | INTRAVENOUS | Status: DC
  Administered 2011-07-14: 4 g/h via INTRAVENOUS
  Administered 2011-07-15: 2 g/h via INTRAVENOUS
  Filled 2011-07-14 (×2): qty 500

## 2011-07-14 MED ORDER — SENNOSIDES-DOCUSATE SODIUM 8.6-50 MG PO TABS
2.0000 | ORAL_TABLET | Freq: Every day | ORAL | Status: DC
  Administered 2011-07-15: 2 via ORAL

## 2011-07-14 NOTE — Progress Notes (Signed)
Pt had C/S on 1/26.  C/O chest pain & HA since Friday.  Pt denies PIH.  Chest pain is substernal - hurts worse & up into throat when she eats & drinks.  Denies pain radiating into neck or down arms.  Pt denies blurred vision with HA's.

## 2011-07-14 NOTE — H&P (Signed)
Rita Long is an 21 y.o. female. Presents with c/o 3 days of headache that hurts all over throbbing with no visual spots or blurring and 3 days of chest pain pressure that is sharper at times occurs mostly in morning and evening sharp at times no cough nothing makes it better or worse. Continued motrin q 6 hours and 1 percocet q 4 hours since C/S did not take today because I had to go to the office but the medicine is not helping,, breastfeeding q 3 hours, with swelling to legs not hands and face, ve who I. at the ry little bleeding brown in the, agrees to IV magnesium Post primary C/S on Jan 16 for breech and ongoing back pain requiring medication and short stature  Past Medical History  Diagnosis Date  . Fibroid     Past Surgical History  Procedure Date  . Cesarean section 07/01/2011    Procedure: CESAREAN SECTION;  Surgeon: Esmeralda Arthur, MD;  Location: [facility];  Service: Gynecology;  Laterality: N/A;    Family History  Problem Relation Age of Onset  . Hypertension Mother   . Anesthesia problems Neg Hx   . Hypotension Neg Hx   . Malignant hyperthermia Neg Hx   . Pseudochol deficiency Neg Hx     Social History:  reports that she quit smoking about 12 months ago. She has never used smokeless tobacco. She reports that she does not drink alcohol or use illicit drugs.  Allergies: No Known Allergies  Prescriptions prior to admission  Medication Sig Dispense Refill  . cyclobenzaprine (FLEXERIL) 10 MG tablet Take 10 mg by mouth 3 (three) times daily as needed. Side pain, cramps      . ibuprofen (ADVIL,MOTRIN) 600 MG tablet Take 1 tablet (600 mg total) by mouth every 6 (six) hours.  30 tablet  2  . oxyCODONE-acetaminophen (PERCOCET) 5-325 MG per tablet Take 1 tablet by mouth every 4 (four) hours as needed. Pain, cramps      . Prenatal Vit-Fe Fumarate-FA (PRENATAL MULTIVITAMIN) TABS Take 1 tablet by mouth daily.        Review of Systems  Constitutional: Negative.   Eyes:  Negative.   Respiratory: Negative.   Cardiovascular: Positive for chest pain.  Gastrointestinal: Negative.   Genitourinary: Negative.   Musculoskeletal: Negative.   Neurological: Positive for headaches.  Endo/Heme/Allergies: Negative.   Psychiatric/Behavioral: Negative.     Blood pressure 185/90, pulse 45, temperature 98.9 F (37.2 C), temperature source Oral, resp. rate 18, height 5' (1.524 m), weight 150 lb (68.04 kg), SpO2 98.00%, currently breastfeeding. Physical Exam Calm quiet in on obvious distress resting in bed lungs clear bilaterally on auscultation, eary respirations, no guarding with repositioning, AP regular, abd soft NT neg CVAT bilaterally, DTRS +2 bilaterally on clonus, edema +1bilaterally lower legs Neg Homan's sign bilaterally  Results for orders placed during the hospital encounter of 07/14/11 (from the past 24 hour(s))  URINALYSIS, ROUTINE W REFLEX MICROSCOPIC     Status: Abnormal   Collection Time   07/14/11 12:59 PM      Component Value Range   Color, Urine YELLOW  YELLOW    APPearance CLEAR  CLEAR    Specific Gravity, Urine <1.005 (*) 1.005 - 1.030    pH 7.0  5.0 - 8.0    Glucose, UA NEGATIVE  NEGATIVE (mg/dL)   Hgb urine dipstick LARGE (*) NEGATIVE    Bilirubin Urine NEGATIVE  NEGATIVE    Ketones, ur NEGATIVE  NEGATIVE (mg/dL)   Protein,  ur NEGATIVE  NEGATIVE (mg/dL)   Urobilinogen, UA 0.2  0.0 - 1.0 (mg/dL)   Nitrite NEGATIVE  NEGATIVE    Leukocytes, UA TRACE (*) NEGATIVE   URINE MICROSCOPIC-ADD ON     Status: Abnormal   Collection Time   07/14/11 12:59 PM      Component Value Range   Squamous Epithelial / LPF RARE  RARE    WBC, UA 0-2  <3 (WBC/hpf)   RBC / HPF 7-10  <3 (RBC/hpf)   Bacteria, UA FEW (*) RARE   CBC     Status: Abnormal   Collection Time   07/14/11  1:13 PM      Component Value Range   WBC 5.7  4.0 - 10.5 (K/uL)   RBC 3.69 (*) 3.87 - 5.11 (MIL/uL)   Hemoglobin 11.5 (*) 12.0 - 15.0 (g/dL)   HCT 16.1 (*) 09.6 - 46.0 (%)   MCV 95.7   78.0 - 100.0 (fL)   MCH 31.2  26.0 - 34.0 (pg)   MCHC 32.6  30.0 - 36.0 (g/dL)   RDW 04.5  40.9 - 81.1 (%)   Platelets 285  150 - 400 (K/uL)  DIFFERENTIAL     Status: Normal   Collection Time   07/14/11  1:13 PM      Component Value Range   Neutrophils Relative 65  43 - 77 (%)   Neutro Abs 3.7  1.7 - 7.7 (K/uL)   Lymphocytes Relative 26  12 - 46 (%)   Lymphs Abs 1.5  0.7 - 4.0 (K/uL)   Monocytes Relative 7  3 - 12 (%)   Monocytes Absolute 0.4  0.1 - 1.0 (K/uL)   Eosinophils Relative 1  0 - 5 (%)   Eosinophils Absolute 0.1  0.0 - 0.7 (K/uL)   Basophils Relative 0  0 - 1 (%)   Basophils Absolute 0.0  0.0 - 0.1 (K/uL)  COMPREHENSIVE METABOLIC PANEL     Status: Abnormal   Collection Time   07/14/11  1:13 PM      Component Value Range   Sodium 140  135 - 145 (mEq/L)   Potassium 4.0  3.5 - 5.1 (mEq/L)   Chloride 106  96 - 112 (mEq/L)   CO2 26  19 - 32 (mEq/L)   Glucose, Bld 84  70 - 99 (mg/dL)   BUN 14  6 - 23 (mg/dL)   Creatinine, Ser 9.14  0.50 - 1.10 (mg/dL)   Calcium 8.6  8.4 - 78.2 (mg/dL)   Total Protein 7.0  6.0 - 8.3 (g/dL)   Albumin 3.2 (*) 3.5 - 5.2 (g/dL)   AST 14  0 - 37 (U/L)   ALT 16  0 - 35 (U/L)   Alkaline Phosphatase 66  39 - 117 (U/L)   Total Bilirubin 0.3  0.3 - 1.2 (mg/dL)   GFR calc non Af Amer >90  >90 (mL/min)   GFR calc Af Amer >90  >90 (mL/min)  URIC ACID     Status: Normal   Collection Time   07/14/11  1:13 PM      Component Value Range   Uric Acid, Serum 5.9  2.4 - 7.0 (mg/dL)    No results found.  Assessment/Plan: Post Op Cesarean section 16 days Headache Elevated bp Plan  See CBC, uric acid, ua, CMP results. CT head, EKG, cardiac enzymes, 24 hour urine protein and creatinine, maalox no , magnesium, dilaudid, to ICU collaboration with Dr. Su Hilt at [facility] suite.  Mayu Ronk 07/14/2011, 3:18  PM

## 2011-07-14 NOTE — Progress Notes (Signed)
Pt had C/S on 1/16, not 1/26.

## 2011-07-14 NOTE — ED Notes (Signed)
Generalized HA, pt denies visual changes, epigastric pain.  Non-pitting edema noted in feet bilaterally, DTR's 1+, no clonus.

## 2011-07-14 NOTE — ED Notes (Signed)
Cordelia Pen, RN in [place] given report on pt. Pt to go to room 374 AICU.

## 2011-07-15 DIAGNOSIS — O165 Unspecified maternal hypertension, complicating the puerperium: Secondary | ICD-10-CM | POA: Diagnosis present

## 2011-07-15 LAB — PROTEIN, URINE, 24 HOUR
Collection Interval-UPROT: 24 hours
Protein, Urine: 3 mg/dL
Urine Total Volume-UPROT: 4350 mL

## 2011-07-15 MED ORDER — NIFEDIPINE ER 30 MG PO TB24
30.0000 mg | ORAL_TABLET | Freq: Every day | ORAL | Status: DC
  Administered 2011-07-15 – 2011-07-16 (×2): 30 mg via ORAL
  Filled 2011-07-15 (×4): qty 1

## 2011-07-15 MED ORDER — MAGNESIUM SULFATE 40 G IN LACTATED RINGERS - SIMPLE: 2.0000 g/h | INTRAVENOUS | Status: DC

## 2011-07-15 NOTE — Progress Notes (Addendum)
Called Dr. Pennie Rushing to update about pt's BP's four hours after magnesium was shut off. Magnesium was shut off at 1600. Also updated that pt denies any headache or visual disturbances. Dr. Pennie Rushing wants pt to stay overnight and ordered Procardia XL 30 mg to be started six hours after magnesium shut off. Orders also given that pt can transfer out to [facility] Unit.

## 2011-07-15 NOTE — Progress Notes (Signed)
Post Partum Day 15 Subjective: Pt was admitted on 07-14-11 with elevated blood pressures and severe headache.  She was admitted to rule out pre-eclampsia with collection of a 24 hour urine and treatment with magnesium sulfate. Headache is improved.  Pt denies abdominal pain. Headache started on 07/10/11, 12 days postpartum.  It is not positional. Head CT was essentially neg, but subtle changes which in the appropriate setting could be associated with PRES were present unilaterally Objective: Blood pressure 115/71, pulse 78, temperature 97.8 F (36.6 C), temperature source Oral, resp. rate 18, height 5' (1.524 m), weight 149 lb 6.4 oz (67.767 kg), SpO2 98.00%, currently breastfeeding.  Physical Exam:  General: alert, cooperative and mild distress Lochia: appropriate Uterine Fundus: firm Incision: healing well DVT Evaluation: No evidence of DVT seen on physical exam. Wt 149 (down 1 pound from 07-14-11) Uriine output 1900cc thus far today  Basename 07/14/11 1313  HGB 11.5*  HCT 35.3*    Assessment/Plan: Plan for discharge tomorrow Will continue magnesium sulfate to complete 24 hours Complete 24 hour urine for protien Treat post magnesium BP elevation as needed If headache recurs will plan MRI to rule out PRES and spinal headache. Pt requests discharge home asap after magnesium discontinued.  Will observe at least 4 hours after discontinuation of magnesium,  LOS: 1 day   Tereza Gilham P 07/15/2011, 3:02 PM

## 2011-07-15 NOTE — Progress Notes (Signed)
UR chart review completed.  

## 2011-07-15 NOTE — Consult Note (Signed)
I met with mom briefly today in AICU. She was readmitted with headache and increased BP. She did not have the baby with her. A DEP was provided for her, but she had not pumped since last night. I explained that if she wanted to continue breast feeding, that she needed to pump every 3 hours, etc. Mom agreed to begin pumping while I was there. Her breast were soft, with easily expressed milk.

## 2011-07-16 ENCOUNTER — Encounter (HOSPITAL_COMMUNITY): Payer: Self-pay | Admitting: *Deleted

## 2011-07-16 MED ORDER — NIFEDIPINE ER 30 MG PO TB24: 30.0000 mg | ORAL_TABLET | Freq: Every day | ORAL | Status: DC

## 2011-07-16 NOTE — Discharge Summary (Signed)
Physician Discharge Summary  Patient ID: Roshawn Lacina MRN: [id number] DOB/AGE: [birth year] 21 y.o.  Admit date: 07/14/2011 Discharge date: 07/16/2011  Admission Diagnoses:  13 days s/p scheduled C/S for breech                                          PP headache                                          PP hypertension--r/o pre-eclampsia                                          Chest pain  Discharge Diagnoses:  Mild pp hypertension                                             Discharged Condition: good  Hospital Course:  Admitted 07/14/11 with HA, chest pain, hypertension.  Started on Magnesium sulfate x 24 hours.  CT scan had no major abnormalities, but some subtle changes possibly associated with Posterior reversible encephalopathy syndrome--although no other clinical findings were associated with this differential diagnosis, and no further follow-up was needed.  She was placed on Procardia XL 30 mg, with benefit to her BP.  Her headache resolved, and on 07/16/11, she was discharged home in stable condition.   All labs were WNL.   Consults: None  Significant Diagnostic Studies: labs: See labs, and radiology: CT scan: See report  Treatments: IV hydration, analgesia: Dilaudid and magnesium sulfate  Discharge Exam: Blood pressure 131/84, pulse 76, temperature 98 F (36.7 C), temperature source Oral, resp. rate 20, height 5' (1.524 m), weight 65.375 kg (144 lb 2 oz), SpO2 96.00%, currently breastfeeding. General appearance: alert Resp: clear to auscultation bilaterally Chest wall: no tenderness Pelvic: deferred Extremities: extremities normal, atraumatic, no cyanosis or edema Incision/Wound:  C/S incision CDI  Disposition: Home or Self Care   Medication List  As of 07/16/2011  1:41 PM   STOP taking these medications         ibuprofen 600 MG tablet         TAKE these medications         cyclobenzaprine 10 MG tablet   Commonly known as: FLEXERIL   Take 10 mg by mouth 3  (three) times daily as needed. Side pain, cramps      NIFEdipine 30 MG 24 hr tablet   Commonly known as: PROCARDIA-XL/ADALAT CC   Take 1 tablet (30 mg total) by mouth daily.      oxyCODONE-acetaminophen 5-325 MG per tablet   Commonly known as: PERCOCET   Take 1 tablet by mouth every 4 (four) hours as needed. Pain, cramps      prenatal multivitamin Tabs   Take 1 tablet by mouth daily.           Follow-up Information    Follow up with [facility]. (As scheduled for postpartum appointment or as needed)          Signed: Nigel Bridgeman 07/16/2011, 1:41 PM

## 2011-09-08 ENCOUNTER — Encounter (INDEPENDENT_AMBULATORY_CARE_PROVIDER_SITE_OTHER): Payer: Medicaid Other | Admitting: Obstetrics and Gynecology

## 2011-09-08 DIAGNOSIS — Z30430 Encounter for insertion of intrauterine contraceptive device: Secondary | ICD-10-CM

## 2011-09-23 DIAGNOSIS — Z332 Encounter for elective termination of pregnancy: Secondary | ICD-10-CM

## 2011-09-23 DIAGNOSIS — D219 Benign neoplasm of connective and other soft tissue, unspecified: Secondary | ICD-10-CM | POA: Insufficient documentation

## 2011-09-23 DIAGNOSIS — R6252 Short stature (child): Secondary | ICD-10-CM

## 2011-09-23 DIAGNOSIS — Z3687 Encounter for antenatal screening for uncertain dates: Secondary | ICD-10-CM

## 2011-09-24 ENCOUNTER — Encounter: Payer: Medicaid Other | Admitting: Obstetrics and Gynecology

## 2011-10-27 ENCOUNTER — Emergency Department (HOSPITAL_COMMUNITY)
Admission: EM | Admit: 2011-10-27 | Discharge: 2011-10-27 | Disposition: A | Discharge status: Home / Self Care | Payer: Medicaid Other | Attending: Emergency Medicine | Admitting: Emergency Medicine

## 2011-10-27 ENCOUNTER — Encounter (HOSPITAL_COMMUNITY): Payer: Self-pay | Admitting: *Deleted

## 2011-10-27 DIAGNOSIS — S29012A Strain of muscle and tendon of back wall of thorax, initial encounter: Secondary | ICD-10-CM

## 2011-10-27 DIAGNOSIS — S239XXA Sprain of unspecified parts of thorax, initial encounter: Secondary | ICD-10-CM | POA: Insufficient documentation

## 2011-10-27 DIAGNOSIS — X58XXXA Exposure to other specified factors, initial encounter: Secondary | ICD-10-CM | POA: Insufficient documentation

## 2011-10-27 DIAGNOSIS — M546 Pain in thoracic spine: Secondary | ICD-10-CM | POA: Insufficient documentation

## 2011-10-27 DIAGNOSIS — M542 Cervicalgia: Secondary | ICD-10-CM | POA: Insufficient documentation

## 2011-10-27 DIAGNOSIS — M25519 Pain in unspecified shoulder: Secondary | ICD-10-CM | POA: Insufficient documentation

## 2011-10-27 MED ORDER — DIAZEPAM 5 MG PO TABS
5.0000 mg | ORAL_TABLET | Freq: Once | ORAL | Status: AC
  Administered 2011-10-27: 5 mg via ORAL
  Filled 2011-10-27: qty 1

## 2011-10-27 MED ORDER — DIAZEPAM 5 MG PO TABS: 5.0000 mg | ORAL_TABLET | Freq: Four times a day (QID) | ORAL | Status: AC | PRN

## 2011-10-27 MED ORDER — TRAMADOL HCL 50 MG PO TABS
50.0000 mg | ORAL_TABLET | Freq: Once | ORAL | Status: AC
  Administered 2011-10-27: 50 mg via ORAL
  Filled 2011-10-27: qty 1

## 2011-10-27 MED ORDER — TRAMADOL HCL 50 MG PO TABS: 50.0000 mg | ORAL_TABLET | Freq: Four times a day (QID) | ORAL | Status: AC | PRN

## 2011-10-27 NOTE — ED Provider Notes (Signed)
History     CSN: [id number]  Arrival date & time 10/27/11  2044   First MD Initiated Contact with Patient 10/27/11 2125      Chief Complaint  Patient presents with  . Back Pain  . Neck Pain    (Consider location/radiation/quality/duration/timing/severity/associated sxs/prior treatment) HPI Comments: Patient has had bilateral shoulder and neck pain for the past week.  It is now radiating downward.  She is 15 weeks postpartum, post C-section.  She is currently not working, but staying home taking care of her child.  She has been trying over-the-counter ibuprofen, without any relief.  It is now gotten to the point where she can't sleep restfully due to the discomfort. She states she does not have a primary care physician.  She was seen by her OB/GYN at her six-week checkup and all is healing well as far as her C section  The history is provided by the patient.    Past Medical History  Diagnosis Date  . Fibroid     "unsure"    Past Surgical History  Procedure Date  . Cesarean section 07/01/2011    Procedure: CESAREAN SECTION;  Surgeon: Esmeralda Arthur, MD;  Location: [facility];  Service: Gynecology;  Laterality: N/A;    Family History  Problem Relation Age of Onset  . Hypertension Mother   . Anesthesia problems Neg Hx   . Hypotension Neg Hx   . Malignant hyperthermia Neg Hx   . Pseudochol deficiency Neg Hx     History  Substance Use Topics  . Smoking status: Former Smoker -- 0.2 packs/day    Quit date: 06/22/2010  . Smokeless tobacco: Never Used   Comment: not currently smoking or using marijuana  . Alcohol Use: No    OB History    Grav Para Term Preterm Abortions TAB SAB Ect Mult Living   3 1 1  0 2 2 0 0 0 1      Review of Systems  HENT: Positive for neck pain. Negative for neck stiffness.   Respiratory: Negative for cough and shortness of breath.   Cardiovascular: Negative for chest pain and leg swelling.  Musculoskeletal: Positive for back pain.     Allergies  Review of patient's allergies indicates no known allergies.  Home Medications   Current Outpatient Rx  Name Route Sig Dispense Refill  . IBUPROFEN 800 MG PO TABS Oral Take 800 mg by mouth every 8 (eight) hours as needed. For pain    . DIAZEPAM 5 MG PO TABS Oral Take 1 tablet (5 mg total) by mouth every 6 (six) hours as needed for anxiety. 20 tablet 0  . TRAMADOL HCL 50 MG PO TABS Oral Take 1 tablet (50 mg total) by mouth every 6 (six) hours as needed for pain. 30 tablet 0    BP 110/74  Pulse 66  Temp(Src) 98 F (36.7 C) (Oral)  Resp 18  SpO2 100%  Breastfeeding? No  Physical Exam  Constitutional: She appears well-developed and well-nourished.  HENT:  Head: Normocephalic.  Eyes: Pupils are equal, round, and reactive to light.  Neck: Normal range of motion. No thyromegaly present.    Cardiovascular: Normal rate.   Pulmonary/Chest: Effort normal. She exhibits no tenderness.  Abdominal: Soft.  Musculoskeletal: She exhibits tenderness.       Arms:   ED Course  Procedures (including critical care time)  Labs Reviewed - No data to display No results found.   1. Muscle strain of right upper back  MDM  This appears to be muscular in nature, as it is getting worse.  She is now picking up a 15 week infant out of the crib at of car seats etc., which is a very different.  Movement for her.  She worked as a Lawyer.  She has had no falls, ruling out, need for x-ray at this, time.  We'll treat for muscle pain.  Patient is agreeable to this        Arman Filter, NP 10/27/11 2206  Arman Filter, NP 10/27/11 2207

## 2011-10-27 NOTE — ED Notes (Addendum)
C/o back and neck pain, worse on the R. No relief with ibuprofen, c-section 15 weeks ago. Describes as sharp and pressure. Onset 1 week ago. (denies: sob fever or nvd), "hurts when she lies down and talks".

## 2011-10-27 NOTE — Discharge Instructions (Signed)
Back Pain, Adult Back pain is very common. The pain often gets better over time. The cause of back pain is usually not dangerous. Most people can learn to manage their back pain on their own.  HOME CARE   Stay active. Start with short walks on flat ground if you can. Try to walk farther each day.   Do not sit, drive, or stand in one place for more than 30 minutes. Do not stay in bed.   Do not avoid exercise or work. Activity can help your back heal faster.   Be careful when you bend or lift an object. Bend at your knees, keep the object close to you, and do not twist.   Sleep on a firm mattress. Lie on your side, and bend your knees. If you lie on your back, put a pillow under your knees.   Only take medicines as told by your doctor.   Put ice on the injured area.   Put ice in a plastic bag.   Place a towel between your skin and the bag.   Leave the ice on for 15 to 20 minutes, 3 to 4 times a day for the first 2 to 3 days. After that, you can switch between ice and heat packs.   Ask your doctor about back exercises or massage.   Avoid feeling anxious or stressed. Find good ways to deal with stress, such as exercise.  GET HELP RIGHT AWAY IF:   Your pain does not go away with rest or medicine.   Your pain does not go away in 1 week.   You have new problems.   You do not feel well.   The pain spreads into your legs.   You cannot control when you poop (bowel movement) or pee (urinate).   Your arms or legs feel weak or lose feeling (numbness).   You feel sick to your stomach (nauseous) or throw up (vomit).   You have belly (abdominal) pain.   You feel like you may pass out (faint).  MAKE SURE YOU:   Understand these instructions.   Will watch your condition.   Will get help right away if you are not doing well or get worse.  Document Released: 11/18/2007 Document Revised: 05/21/2011 Document Reviewed: 10/20/2010 [facility] Patient Information 2012 [place],  [place]. Take the medication as directed for the full length of time.  He can use heat or ice to sore muscles as well

## 2011-10-30 NOTE — ED Provider Notes (Signed)
Medical screening examination/treatment/procedure(s) were performed by non-physician practitioner and as supervising physician I was immediately available for consultation/collaboration.   Forbes Cellar, MD 10/30/11 [phone]

## 2012-01-21 ENCOUNTER — Ambulatory Visit: Payer: Medicaid Other | Admitting: Obstetrics and Gynecology

## 2012-02-23 ENCOUNTER — Telehealth: Payer: Self-pay | Admitting: Obstetrics and Gynecology

## 2012-02-23 ENCOUNTER — Emergency Department (HOSPITAL_COMMUNITY)
Admission: EM | Admit: 2012-02-23 | Discharge: 2012-02-23 | Disposition: A | Discharge status: Home / Self Care | Payer: Self-pay | Attending: Emergency Medicine | Admitting: Emergency Medicine

## 2012-02-23 ENCOUNTER — Encounter (HOSPITAL_COMMUNITY): Payer: Self-pay | Admitting: Family Medicine

## 2012-02-23 DIAGNOSIS — Z87891 Personal history of nicotine dependence: Secondary | ICD-10-CM | POA: Insufficient documentation

## 2012-02-23 DIAGNOSIS — F53 Postpartum depression: Secondary | ICD-10-CM

## 2012-02-23 DIAGNOSIS — O99345 Other mental disorders complicating the puerperium: Secondary | ICD-10-CM | POA: Insufficient documentation

## 2012-02-23 DIAGNOSIS — F3289 Other specified depressive episodes: Secondary | ICD-10-CM | POA: Insufficient documentation

## 2012-02-23 DIAGNOSIS — F329 Major depressive disorder, single episode, unspecified: Secondary | ICD-10-CM | POA: Insufficient documentation

## 2012-02-23 MED ORDER — SERTRALINE HCL 50 MG PO TABS: 50.0000 mg | ORAL_TABLET | Freq: Every day | ORAL | Status: AC

## 2012-02-23 NOTE — ED Provider Notes (Signed)
History     CSN: [id number]  Arrival date & time 02/23/12  1616   First MD Initiated Contact with Patient 02/23/12 1627      Chief Complaint  Patient presents with  . Depression   HPI  History provided by the patient. Patient is a 21 year old female with no significant PMH who presents with concerns for continued or worsening postpartum depression. Patient gave birth to her first child in January of this year. She reports having increasing depression following this delivery. Patient states she had mild problems of depression and anxiety prior to her pregnancy has been much worse since delivery. Patient has been discussing this with her OB/GYN provider, Dr. Estanislado Pandy. Patient was encouraged to come to the emergency room for additional help with symptoms. She does report occasionally some thoughts of suicide but currently denies any suicidal ideations and is not feel she would act on past thoughts. She denies any thoughts of harm to the son. Patient does live at home with her parents she reports are supportive and loving. Patient has never been treated for depression. She has taken Valium as needed for anxiety symptoms. She denies any other complaints currently. Denies any hallucinations. Patient denies any drug or alcohol use. Patient is not breast feeding.     Past Medical History  Diagnosis Date  . Fibroid     "unsure"    Past Surgical History  Procedure Date  . Cesarean section 07/01/2011    Procedure: CESAREAN SECTION;  Surgeon: Esmeralda Arthur, MD;  Location: [facility];  Service: Gynecology;  Laterality: N/A;    Family History  Problem Relation Age of Onset  . Hypertension Mother   . Anesthesia problems Neg Hx   . Hypotension Neg Hx   . Malignant hyperthermia Neg Hx   . Pseudochol deficiency Neg Hx     History  Substance Use Topics  . Smoking status: Former Smoker -- 0.2 packs/day    Quit date: 06/22/2010  . Smokeless tobacco: Never Used   Comment: not currently smoking or  using marijuana  . Alcohol Use: No    OB History    Grav Para Term Preterm Abortions TAB SAB Ect Mult Living   3 1 1  0 2 2 0 0 0 1      Review of Systems  Constitutional: Negative for fever and chills.  Respiratory: Negative for shortness of breath.   Cardiovascular: Negative for chest pain.  Psychiatric/Behavioral: Negative for hallucinations, confusion and self-injury.    Allergies  Review of patient's allergies indicates no known allergies.  Home Medications   Current Outpatient Rx  Name Route Sig Dispense Refill  . DIAZEPAM 5 MG PO TABS Oral Take 5 mg by mouth every 8 (eight) hours as needed. For anxiety.    . IBUPROFEN 800 MG PO TABS Oral Take 800 mg by mouth every 8 (eight) hours as needed. For pain    . LEVONORGESTREL 20 MCG/24HR IU IUD Intrauterine 1 each by Intrauterine route once.      BP 114/70  Pulse 81  Temp 98.5 F (36.9 C) (Oral)  Resp 18  SpO2 100%  Breastfeeding? No  Physical Exam  Nursing note and vitals reviewed. Constitutional: She is oriented to person, place, and time. She appears well-developed and well-nourished. No distress.  HENT:  Head: Normocephalic.  Cardiovascular: Normal rate and regular rhythm.   Pulmonary/Chest: Effort normal and breath sounds normal. No respiratory distress. She has no wheezes. She has no rales.  Neurological: She is alert and  oriented to person, place, and time.  Skin: Skin is warm and dry.  Psychiatric: Her behavior is normal. She exhibits a depressed mood. She expresses no homicidal and no suicidal ideation. She expresses no suicidal plans and no homicidal plans.       Tearful    ED Course  Procedures       1. Post partum depression       MDM  4:25 PM patient seen and evaluated. Will order tele psych consult for evaluation and recommendations. Patient is not interested in any patient treatments. She denies any SI or HI currently. No prior history of SI or HI attempts.   She was evaluated by Dr.  Ma Hillock psychiatry. She recommends starting patient on Zoloft 50 mg once in the morning. Acting also, provide information for psychiatric followup. Patient is not suicidal or homicidal and felt able to return home. She is living with parents who give good support        Angus Seller, [place] 02/23/12 1831

## 2012-02-23 NOTE — ED Notes (Signed)
Pt reports she had her son on 07/01/11 and since March has been feeling depressed. Pt reports SI, but denies having a plan. Denies HI. Denies hallucinations. Pt has son with her at this time. NAD noted at this time. Pt calm and cooperative.

## 2012-02-23 NOTE — ED Notes (Signed)
PA at bedside.

## 2012-02-23 NOTE — ED Notes (Signed)
telepysch being conducted

## 2012-02-23 NOTE — Telephone Encounter (Signed)
CHANDRA/TOOK CALL

## 2012-02-23 NOTE — ED Notes (Signed)
telepsych called and requested 

## 2012-02-23 NOTE — Telephone Encounter (Signed)
Lm on vm to cb per telephone call.  

## 2012-02-23 NOTE — Telephone Encounter (Signed)
Tc to pt per telephone call. Pt states,"delivered 06/2011 without any complications at post partum visit. Pt c/o "depression" approx 2 months after delivery on/off. Pt admits to being suicidal;however not homicidal.  Pt with suicidal thoughts on yesterday and approx 2 months ago. Pt admits to never attempting suicide;just has thoughts about it. Pt states", my main stress is that I am a single parent". Pt asked if needs assistance with any resources(i.e food, water, shelter,etc);pt denies these needs. Consulted with Angelique Blonder Davies(CNM on call), pt to go to [facility] ER for eval. Pt agrees to go today. Spoke with Tracy(RN) @ [facility] ER to make aware.

## 2012-02-27 NOTE — ED Provider Notes (Signed)
Medical screening examination/treatment/procedure(s) were performed by non-physician practitioner and as supervising physician I was immediately available for consultation/collaboration.  Jones Skene, M.D.     Jones Skene, MD 02/27/12 1646

## 2013-06-15 HISTORY — PX: WISDOM TOOTH EXTRACTION: SHX21

## 2014-04-16 ENCOUNTER — Encounter (HOSPITAL_COMMUNITY): Payer: Self-pay | Admitting: Family Medicine

## 2019-01-03 ENCOUNTER — Other Ambulatory Visit: Payer: Self-pay | Admitting: Obstetrics and Gynecology

## 2019-01-03 DIAGNOSIS — Z20822 Contact with and (suspected) exposure to covid-19: Secondary | ICD-10-CM

## 2019-01-05 LAB — NOVEL CORONAVIRUS, NAA: SARS-CoV-2, NAA: NOT DETECTED

## 2019-01-18 ENCOUNTER — Observation Stay (HOSPITAL_COMMUNITY)
Admission: RE | Admit: 2019-01-18 | Discharge: 2019-01-19 | Disposition: A | Discharge status: Home / Self Care | Payer: BLUE CROSS/BLUE SHIELD | Source: Ambulatory Visit | Attending: Orthopaedic Surgery | Admitting: Orthopaedic Surgery

## 2019-01-18 ENCOUNTER — Ambulatory Visit (HOSPITAL_COMMUNITY): Payer: BLUE CROSS/BLUE SHIELD | Admitting: Certified Registered"

## 2019-01-18 ENCOUNTER — Emergency Department (HOSPITAL_COMMUNITY): Payer: BLUE CROSS/BLUE SHIELD

## 2019-01-18 ENCOUNTER — Encounter: Payer: Self-pay | Admitting: Orthopaedic Surgery

## 2019-01-18 ENCOUNTER — Encounter (HOSPITAL_COMMUNITY)
Admission: RE | Disposition: A | Discharge status: Home / Self Care | Payer: Self-pay | Source: Ambulatory Visit | Attending: Orthopaedic Surgery

## 2019-01-18 ENCOUNTER — Other Ambulatory Visit: Payer: Self-pay

## 2019-01-18 ENCOUNTER — Other Ambulatory Visit (HOSPITAL_COMMUNITY)
Admission: RE | Admit: 2019-01-18 | Discharge: 2019-01-18 | Disposition: A | Discharge status: Home / Self Care | Payer: BLUE CROSS/BLUE SHIELD | Source: Ambulatory Visit | Attending: Orthopaedic Surgery | Admitting: Orthopaedic Surgery

## 2019-01-18 ENCOUNTER — Ambulatory Visit: Payer: BLUE CROSS/BLUE SHIELD | Admitting: Orthopaedic Surgery

## 2019-01-18 ENCOUNTER — Encounter (HOSPITAL_COMMUNITY): Payer: Self-pay

## 2019-01-18 ENCOUNTER — Emergency Department (HOSPITAL_COMMUNITY)
Admission: EM | Admit: 2019-01-18 | Discharge: 2019-01-18 | Disposition: A | Discharge status: Home / Self Care | Payer: BLUE CROSS/BLUE SHIELD | Source: Home / Self Care | Attending: Emergency Medicine | Admitting: Emergency Medicine

## 2019-01-18 ENCOUNTER — Ambulatory Visit (HOSPITAL_COMMUNITY): Payer: BLUE CROSS/BLUE SHIELD

## 2019-01-18 ENCOUNTER — Encounter (HOSPITAL_COMMUNITY): Payer: Self-pay | Admitting: Anesthesiology

## 2019-01-18 DIAGNOSIS — S99911A Unspecified injury of right ankle, initial encounter: Secondary | ICD-10-CM | POA: Diagnosis present

## 2019-01-18 DIAGNOSIS — X501XXA Overexertion from prolonged static or awkward postures, initial encounter: Secondary | ICD-10-CM | POA: Insufficient documentation

## 2019-01-18 DIAGNOSIS — Z20828 Contact with and (suspected) exposure to other viral communicable diseases: Secondary | ICD-10-CM | POA: Insufficient documentation

## 2019-01-18 DIAGNOSIS — W06XXXA Fall from bed, initial encounter: Secondary | ICD-10-CM | POA: Insufficient documentation

## 2019-01-18 DIAGNOSIS — S82841A Displaced bimalleolar fracture of right lower leg, initial encounter for closed fracture: Secondary | ICD-10-CM | POA: Insufficient documentation

## 2019-01-18 DIAGNOSIS — Z79899 Other long term (current) drug therapy: Secondary | ICD-10-CM | POA: Insufficient documentation

## 2019-01-18 DIAGNOSIS — Z87891 Personal history of nicotine dependence: Secondary | ICD-10-CM | POA: Insufficient documentation

## 2019-01-18 DIAGNOSIS — Z419 Encounter for procedure for purposes other than remedying health state, unspecified: Secondary | ICD-10-CM

## 2019-01-18 DIAGNOSIS — Q899 Congenital malformation, unspecified: Secondary | ICD-10-CM

## 2019-01-18 DIAGNOSIS — Y9389 Activity, other specified: Secondary | ICD-10-CM | POA: Insufficient documentation

## 2019-01-18 DIAGNOSIS — Y92003 Bedroom of unspecified non-institutional (private) residence as the place of occurrence of the external cause: Secondary | ICD-10-CM | POA: Insufficient documentation

## 2019-01-18 DIAGNOSIS — S82891A Other fracture of right lower leg, initial encounter for closed fracture: Secondary | ICD-10-CM | POA: Diagnosis present

## 2019-01-18 DIAGNOSIS — Y999 Unspecified external cause status: Secondary | ICD-10-CM | POA: Insufficient documentation

## 2019-01-18 HISTORY — PX: ORIF ANKLE FRACTURE: SHX5408

## 2019-01-18 LAB — CBC
HCT: 43.2 % (ref 36.0–46.0)
Hemoglobin: 14.1 g/dL (ref 12.0–15.0)
MCH: 31.5 pg (ref 26.0–34.0)
MCHC: 32.6 g/dL (ref 30.0–36.0)
MCV: 96.4 fL (ref 80.0–100.0)
Platelets: 301 10*3/uL (ref 150–400)
RBC: 4.48 MIL/uL (ref 3.87–5.11)
RDW: 12 % (ref 11.5–15.5)
WBC: 8.7 10*3/uL (ref 4.0–10.5)
nRBC: 0 % (ref 0.0–0.2)

## 2019-01-18 LAB — COMPREHENSIVE METABOLIC PANEL
ALT: 19 U/L (ref 0–44)
AST: 21 U/L (ref 15–41)
Albumin: 4 g/dL (ref 3.5–5.0)
Alkaline Phosphatase: 47 U/L (ref 38–126)
Anion gap: 10 (ref 5–15)
BUN: 6 mg/dL (ref 6–20)
CO2: 21 mmol/L — ABNORMAL LOW (ref 22–32)
Calcium: 9.5 mg/dL (ref 8.9–10.3)
Chloride: 106 mmol/L (ref 98–111)
Creatinine, Ser: 0.67 mg/dL (ref 0.44–1.00)
GFR calc Af Amer: >60 mL/min (ref >60)
GFR calc non Af Amer: >60 mL/min (ref >60)
Glucose, Bld: 97 mg/dL (ref 70–99)
Potassium: 3.8 mmol/L (ref 3.5–5.1)
Sodium: 137 mmol/L (ref 135–145)
Total Bilirubin: 0.8 mg/dL (ref 0.3–1.2)
Total Protein: 7.5 g/dL (ref 6.5–8.1)

## 2019-01-18 LAB — HCG, SERUM, QUALITATIVE: Preg, Serum: NEGATIVE

## 2019-01-18 LAB — SARS CORONAVIRUS 2 BY RT PCR (HOSPITAL ORDER, PERFORMED IN ~~LOC~~ HOSPITAL LAB): SARS Coronavirus 2: NEGATIVE

## 2019-01-18 SURGERY — OPEN REDUCTION INTERNAL FIXATION (ORIF) ANKLE FRACTURE
Anesthesia: General | Site: Ankle | Laterality: Right

## 2019-01-18 MED ORDER — LACTATED RINGERS IV SOLN
INTRAVENOUS | Status: DC
  Administered 2019-01-18: 17:00:00 via INTRAVENOUS

## 2019-01-18 MED ORDER — HYDROMORPHONE HCL 1 MG/ML IJ SOLN
INTRAMUSCULAR | Status: AC
  Filled 2019-01-18: qty 1

## 2019-01-18 MED ORDER — BUPROPION HCL ER (XL) 150 MG PO TB24
300.0000 mg | ORAL_TABLET | Freq: Every day | ORAL | Status: DC
  Administered 2019-01-19: 300 mg via ORAL
  Filled 2019-01-18: qty 2

## 2019-01-18 MED ORDER — MIDAZOLAM HCL 2 MG/2ML IJ SOLN
2.0000 mg | Freq: Once | INTRAMUSCULAR | Status: AC
  Administered 2019-01-18: 2 mg via INTRAVENOUS

## 2019-01-18 MED ORDER — METHOCARBAMOL 500 MG PO TABS
500.0000 mg | ORAL_TABLET | Freq: Four times a day (QID) | ORAL | Status: DC | PRN
  Administered 2019-01-19: 500 mg via ORAL
  Filled 2019-01-18: qty 1

## 2019-01-18 MED ORDER — ONDANSETRON HCL 4 MG/2ML IJ SOLN
INTRAMUSCULAR | Status: DC | PRN
  Administered 2019-01-18: 4 mg via INTRAVENOUS

## 2019-01-18 MED ORDER — ONDANSETRON HCL 4 MG PO TABS: 4.0000 mg | ORAL_TABLET | Freq: Four times a day (QID) | ORAL | Status: DC | PRN

## 2019-01-18 MED ORDER — ACETAMINOPHEN 160 MG/5ML PO SOLN: 325.0000 mg | Freq: Once | ORAL | Status: DC | PRN

## 2019-01-18 MED ORDER — HYDROMORPHONE HCL 1 MG/ML IJ SOLN
1.0000 mg | Freq: Once | INTRAMUSCULAR | Status: AC
  Administered 2019-01-18: 1 mg via INTRAVENOUS
  Filled 2019-01-18: qty 1

## 2019-01-18 MED ORDER — BUPIVACAINE-EPINEPHRINE (PF) 0.5% -1:200000 IJ SOLN
INTRAMUSCULAR | Status: DC | PRN
  Administered 2019-01-18: 30 mL via PERINEURAL

## 2019-01-18 MED ORDER — FENTANYL CITRATE (PF) 250 MCG/5ML IJ SOLN
INTRAMUSCULAR | Status: AC
  Filled 2019-01-18: qty 5

## 2019-01-18 MED ORDER — PROPOFOL 10 MG/ML IV BOLUS
INTRAVENOUS | Status: AC
  Filled 2019-01-18: qty 40

## 2019-01-18 MED ORDER — CEFAZOLIN SODIUM-DEXTROSE 2-4 GM/100ML-% IV SOLN
2.0000 g | INTRAVENOUS | Status: AC
  Administered 2019-01-18: 2 g via INTRAVENOUS
  Filled 2019-01-18: qty 100

## 2019-01-18 MED ORDER — FENTANYL CITRATE (PF) 250 MCG/5ML IJ SOLN
INTRAMUSCULAR | Status: DC | PRN
  Administered 2019-01-18 (×2): 50 ug via INTRAVENOUS
  Administered 2019-01-18 (×4): 25 ug via INTRAVENOUS

## 2019-01-18 MED ORDER — 0.9 % SODIUM CHLORIDE (POUR BTL) OPTIME
TOPICAL | Status: DC | PRN
  Administered 2019-01-18: 18:00:00 1000 mL

## 2019-01-18 MED ORDER — SODIUM CHLORIDE 0.9 % IV BOLUS (SEPSIS)
1000.0000 mL | Freq: Once | INTRAVENOUS | Status: AC
  Administered 2019-01-18: 1000 mL via INTRAVENOUS

## 2019-01-18 MED ORDER — PROPOFOL 10 MG/ML IV BOLUS
INTRAVENOUS | Status: AC | PRN
  Administered 2019-01-18: 80 mg via INTRAVENOUS
  Administered 2019-01-18: 20 mg via INTRAVENOUS

## 2019-01-18 MED ORDER — ONDANSETRON HCL 4 MG/2ML IJ SOLN
INTRAMUSCULAR | Status: AC
  Filled 2019-01-18: qty 2

## 2019-01-18 MED ORDER — ONDANSETRON HCL 4 MG/2ML IJ SOLN: 4.0000 mg | Freq: Four times a day (QID) | INTRAMUSCULAR | Status: DC | PRN

## 2019-01-18 MED ORDER — ONDANSETRON 4 MG PO TBDP: 4.0000 mg | ORAL_TABLET | Freq: Four times a day (QID) | ORAL | 0 refills | Status: DC | PRN

## 2019-01-18 MED ORDER — PROPOFOL 10 MG/ML IV BOLUS
1.0000 mg/kg | Freq: Once | INTRAVENOUS | Status: DC
  Filled 2019-01-18: qty 20

## 2019-01-18 MED ORDER — PROPOFOL 10 MG/ML IV BOLUS
INTRAVENOUS | Status: AC | PRN
  Administered 2019-01-18: 50 mg via INTRAVENOUS
  Administered 2019-01-18: 24 mg via INTRAVENOUS

## 2019-01-18 MED ORDER — SODIUM CHLORIDE 0.9 % IV SOLN
INTRAVENOUS | Status: DC
  Administered 2019-01-18: 06:00:00 via INTRAVENOUS

## 2019-01-18 MED ORDER — ONDANSETRON HCL 4 MG/2ML IJ SOLN
4.0000 mg | Freq: Once | INTRAMUSCULAR | Status: AC
  Administered 2019-01-18: 4 mg via INTRAVENOUS

## 2019-01-18 MED ORDER — ACETAMINOPHEN 325 MG PO TABS: 325.0000 mg | ORAL_TABLET | Freq: Once | ORAL | Status: DC | PRN

## 2019-01-18 MED ORDER — HYDROMORPHONE HCL 1 MG/ML IJ SOLN: 0.2500 mg | INTRAMUSCULAR | Status: DC | PRN

## 2019-01-18 MED ORDER — ASPIRIN 325 MG PO TABS
325.0000 mg | ORAL_TABLET | Freq: Every day | ORAL | Status: DC
  Administered 2019-01-18 – 2019-01-19 (×2): 325 mg via ORAL
  Filled 2019-01-18 (×2): qty 1

## 2019-01-18 MED ORDER — PROMETHAZINE HCL 25 MG/ML IJ SOLN: 6.2500 mg | INTRAMUSCULAR | Status: DC | PRN

## 2019-01-18 MED ORDER — METHOCARBAMOL 1000 MG/10ML IJ SOLN
500.0000 mg | Freq: Four times a day (QID) | INTRAVENOUS | Status: DC | PRN
  Filled 2019-01-18: qty 5

## 2019-01-18 MED ORDER — PROPOFOL 10 MG/ML IV BOLUS
INTRAVENOUS | Status: DC | PRN
  Administered 2019-01-18: 140 mg via INTRAVENOUS

## 2019-01-18 MED ORDER — CHLORHEXIDINE GLUCONATE 4 % EX LIQD: 60.0000 mL | Freq: Once | CUTANEOUS | Status: DC

## 2019-01-18 MED ORDER — METOCLOPRAMIDE HCL 5 MG PO TABS: 5.0000 mg | ORAL_TABLET | Freq: Three times a day (TID) | ORAL | Status: DC | PRN

## 2019-01-18 MED ORDER — MIDAZOLAM HCL 5 MG/5ML IJ SOLN
INTRAMUSCULAR | Status: DC | PRN
  Administered 2019-01-18 (×2): 1 mg via INTRAVENOUS

## 2019-01-18 MED ORDER — LACTATED RINGERS IV SOLN: INTRAVENOUS | Status: DC

## 2019-01-18 MED ORDER — MORPHINE SULFATE (PF) 2 MG/ML IV SOLN
0.5000 mg | INTRAVENOUS | Status: DC | PRN
  Administered 2019-01-19: 1 mg via INTRAVENOUS
  Filled 2019-01-18: qty 1

## 2019-01-18 MED ORDER — HYDROMORPHONE HCL 1 MG/ML IJ SOLN
1.0000 mg | Freq: Once | INTRAMUSCULAR | Status: AC
  Administered 2019-01-18: 1 mg via INTRAVENOUS

## 2019-01-18 MED ORDER — BUPIVACAINE HCL (PF) 0.25 % IJ SOLN
INTRAMUSCULAR | Status: AC
  Filled 2019-01-18: qty 30

## 2019-01-18 MED ORDER — TRAMADOL HCL 50 MG PO TABS
50.0000 mg | ORAL_TABLET | Freq: Four times a day (QID) | ORAL | Status: DC
  Administered 2019-01-19 (×2): 50 mg via ORAL
  Filled 2019-01-18 (×3): qty 1

## 2019-01-18 MED ORDER — ONDANSETRON HCL 4 MG/2ML IJ SOLN
4.0000 mg | Freq: Once | INTRAMUSCULAR | Status: DC
  Filled 2019-01-18: qty 2

## 2019-01-18 MED ORDER — MEPERIDINE HCL 25 MG/ML IJ SOLN: 6.2500 mg | INTRAMUSCULAR | Status: DC | PRN

## 2019-01-18 MED ORDER — MIDAZOLAM HCL 2 MG/2ML IJ SOLN
INTRAMUSCULAR | Status: AC
  Administered 2019-01-18: 2 mg via INTRAVENOUS
  Filled 2019-01-18: qty 2

## 2019-01-18 MED ORDER — FENTANYL CITRATE (PF) 100 MCG/2ML IJ SOLN
INTRAMUSCULAR | Status: AC
  Administered 2019-01-18: 50 ug via INTRAVENOUS
  Filled 2019-01-18: qty 2

## 2019-01-18 MED ORDER — DOCUSATE SODIUM 100 MG PO CAPS
100.0000 mg | ORAL_CAPSULE | Freq: Two times a day (BID) | ORAL | Status: DC
  Administered 2019-01-18 – 2019-01-19 (×2): 100 mg via ORAL
  Filled 2019-01-18 (×2): qty 1

## 2019-01-18 MED ORDER — ACETAMINOPHEN 325 MG PO TABS: 325.0000 mg | ORAL_TABLET | Freq: Four times a day (QID) | ORAL | Status: DC | PRN

## 2019-01-18 MED ORDER — ACETAMINOPHEN 500 MG PO TABS
500.0000 mg | ORAL_TABLET | Freq: Four times a day (QID) | ORAL | Status: DC
  Administered 2019-01-18 – 2019-01-19 (×3): 500 mg via ORAL
  Filled 2019-01-18 (×4): qty 1

## 2019-01-18 MED ORDER — FENTANYL CITRATE (PF) 100 MCG/2ML IJ SOLN
50.0000 ug | Freq: Once | INTRAMUSCULAR | Status: AC
  Administered 2019-01-18: 18:00:00 50 ug via INTRAVENOUS

## 2019-01-18 MED ORDER — LACTATED RINGERS IV SOLN
INTRAVENOUS | Status: DC | PRN
  Administered 2019-01-18: 18:00:00 via INTRAVENOUS

## 2019-01-18 MED ORDER — OXYCODONE-ACETAMINOPHEN 5-325 MG PO TABS: 2.0000 | ORAL_TABLET | Freq: Four times a day (QID) | ORAL | 0 refills | Status: DC | PRN

## 2019-01-18 MED ORDER — SODIUM CHLORIDE 0.45 % IV SOLN
INTRAVENOUS | Status: DC
  Administered 2019-01-18: 21:00:00 via INTRAVENOUS

## 2019-01-18 MED ORDER — BUPIVACAINE-EPINEPHRINE (PF) 0.25% -1:200000 IJ SOLN
INTRAMUSCULAR | Status: DC | PRN
  Administered 2019-01-18: 15 mL via PERINEURAL

## 2019-01-18 MED ORDER — PHENYLEPHRINE HCL (PRESSORS) 10 MG/ML IV SOLN
INTRAVENOUS | Status: DC | PRN
  Administered 2019-01-18: 40 ug via INTRAVENOUS

## 2019-01-18 MED ORDER — ACETAMINOPHEN 10 MG/ML IV SOLN: 1000.0000 mg | Freq: Once | INTRAVENOUS | Status: DC | PRN

## 2019-01-18 MED ORDER — SERTRALINE HCL 50 MG PO TABS
50.0000 mg | ORAL_TABLET | Freq: Every day | ORAL | Status: DC
  Filled 2019-01-18 (×2): qty 1

## 2019-01-18 MED ORDER — HYDROCODONE-ACETAMINOPHEN 5-325 MG PO TABS
1.0000 | ORAL_TABLET | ORAL | Status: DC | PRN
  Administered 2019-01-19: 1 via ORAL
  Filled 2019-01-18: qty 1

## 2019-01-18 MED ORDER — HYDROCODONE-ACETAMINOPHEN 7.5-325 MG PO TABS
1.0000 | ORAL_TABLET | ORAL | Status: DC | PRN
  Administered 2019-01-19: 1 via ORAL
  Filled 2019-01-18: qty 1

## 2019-01-18 MED ORDER — MIDAZOLAM HCL 2 MG/2ML IJ SOLN
INTRAMUSCULAR | Status: AC
  Filled 2019-01-18: qty 2

## 2019-01-18 MED ORDER — METOCLOPRAMIDE HCL 5 MG/ML IJ SOLN: 5.0000 mg | Freq: Three times a day (TID) | INTRAMUSCULAR | Status: DC | PRN

## 2019-01-18 MED ORDER — LIDOCAINE HCL (CARDIAC) PF 100 MG/5ML IV SOSY
PREFILLED_SYRINGE | INTRAVENOUS | Status: DC | PRN
  Administered 2019-01-18: 60 mg via INTRATRACHEAL

## 2019-01-18 SURGICAL SUPPLY — 71 items
BANDAGE ESMARK 6X9 LF (GAUZE/BANDAGES/DRESSINGS) IMPLANT
BIT DRILL 2.5X2.75 QC CALB (BIT) ×3 IMPLANT
BIT DRILL 2.9 CANN QC NONSTRL (BIT) ×3 IMPLANT
BIT DRILL CALIBRATED 2.7 (BIT) ×2 IMPLANT
BIT DRILL CALIBRATED 2.7MM (BIT) ×1
BNDG ELASTIC 4X5.8 VLCR STR LF (GAUZE/BANDAGES/DRESSINGS) IMPLANT
BNDG ELASTIC 6X5.8 VLCR STR LF (GAUZE/BANDAGES/DRESSINGS) ×3 IMPLANT
BNDG ESMARK 6X9 LF (GAUZE/BANDAGES/DRESSINGS)
COVER MAYO STAND STRL (DRAPES) ×3 IMPLANT
COVER SURGICAL LIGHT HANDLE (MISCELLANEOUS) ×3 IMPLANT
COVER WAND RF STERILE (DRAPES) ×3 IMPLANT
CUFF TOURN SGL QUICK 34 (TOURNIQUET CUFF)
CUFF TOURN SGL QUICK 42 (TOURNIQUET CUFF) IMPLANT
CUFF TRNQT CYL 34X4.125X (TOURNIQUET CUFF) IMPLANT
DRAPE C-ARM 42X72 X-RAY (DRAPES) IMPLANT
DRAPE HALF SHEET 40X57 (DRAPES) ×3 IMPLANT
DRAPE INCISE IOBAN 66X45 STRL (DRAPES) ×3 IMPLANT
DRAPE OEC MINIVIEW 54X84 (DRAPES) ×3 IMPLANT
DRAPE U-SHAPE 47X51 STRL (DRAPES) ×3 IMPLANT
DRSG PAD ABDOMINAL 8X10 ST (GAUZE/BANDAGES/DRESSINGS) ×3 IMPLANT
DURAPREP 26ML APPLICATOR (WOUND CARE) ×3 IMPLANT
ELECT REM PT RETURN 9FT ADLT (ELECTROSURGICAL) ×3
ELECTRODE REM PT RTRN 9FT ADLT (ELECTROSURGICAL) ×1 IMPLANT
GAUZE SPONGE 4X4 12PLY STRL (GAUZE/BANDAGES/DRESSINGS) ×3 IMPLANT
GAUZE SPONGE 4X4 12PLY STRL LF (GAUZE/BANDAGES/DRESSINGS) ×3 IMPLANT
GAUZE XEROFORM 5X9 LF (GAUZE/BANDAGES/DRESSINGS) ×3 IMPLANT
GLOVE BIOGEL PI IND STRL 8 (GLOVE) ×2 IMPLANT
GLOVE BIOGEL PI INDICATOR 8 (GLOVE) ×4
GLOVE ORTHO TXT STRL SZ7.5 (GLOVE) ×6 IMPLANT
GOWN STRL REUS W/ TWL LRG LVL3 (GOWN DISPOSABLE) ×1 IMPLANT
GOWN STRL REUS W/ TWL XL LVL3 (GOWN DISPOSABLE) ×1 IMPLANT
GOWN STRL REUS W/TWL 2XL LVL3 (GOWN DISPOSABLE) ×3 IMPLANT
GOWN STRL REUS W/TWL LRG LVL3 (GOWN DISPOSABLE) ×2
GOWN STRL REUS W/TWL XL LVL3 (GOWN DISPOSABLE) ×2
K-WIRE ACE 1.6X6 (WIRE) ×3
KIT BASIN OR (CUSTOM PROCEDURE TRAY) ×3 IMPLANT
KIT TURNOVER KIT B (KITS) ×3 IMPLANT
KWIRE ACE 1.6X6 (WIRE) ×1 IMPLANT
MANIFOLD NEPTUNE II (INSTRUMENTS) ×3 IMPLANT
NS IRRIG 1000ML POUR BTL (IV SOLUTION) ×3 IMPLANT
PACK ORTHO EXTREMITY (CUSTOM PROCEDURE TRAY) ×3 IMPLANT
PAD ABD 8X10 STRL (GAUZE/BANDAGES/DRESSINGS) ×3 IMPLANT
PAD ARMBOARD 7.5X6 YLW CONV (MISCELLANEOUS) ×6 IMPLANT
PAD CAST 4YDX4 CTTN HI CHSV (CAST SUPPLIES) ×1 IMPLANT
PADDING CAST ABS 6INX4YD NS (CAST SUPPLIES) ×2
PADDING CAST ABS COTTON 6X4 NS (CAST SUPPLIES) ×1 IMPLANT
PADDING CAST COTTON 4X4 STRL (CAST SUPPLIES) ×2
PADDING CAST COTTON 6X4 STRL (CAST SUPPLIES) ×3 IMPLANT
PLATE LOCK 7H 92 BILAT FIB (Plate) ×3 IMPLANT
SCREW ACE CAN 4.0 40M (Screw) ×3 IMPLANT
SCREW CORTICAL 3.5MM  20MM (Screw) ×2 IMPLANT
SCREW CORTICAL 3.5MM 20MM (Screw) ×1 IMPLANT
SCREW LOCK CORT STAR 3.5X10 (Screw) ×6 IMPLANT
SCREW LOW PROFILE 12MMX3.5MM (Screw) ×9 IMPLANT
SCREW NON LOCKING LP 3.5 14MM (Screw) ×3 IMPLANT
SPLINT PLASTER CAST XFAST 5X30 (CAST SUPPLIES) ×1 IMPLANT
SPLINT PLASTER XFAST SET 5X30 (CAST SUPPLIES) ×2
SPONGE LAP 18X18 RF (DISPOSABLE) ×3 IMPLANT
STAPLER VISISTAT 35W (STAPLE) ×3 IMPLANT
SUCTION FRAZIER HANDLE 10FR (MISCELLANEOUS) ×2
SUCTION TUBE FRAZIER 10FR DISP (MISCELLANEOUS) ×1 IMPLANT
SUT ETHILON 3 0 PS 1 (SUTURE) IMPLANT
SUT VIC AB 2-0 CT1 27 (SUTURE) ×4
SUT VIC AB 2-0 CT1 TAPERPNT 27 (SUTURE) ×2 IMPLANT
SYR CONTROL 10ML LL (SYRINGE) ×3 IMPLANT
TOWEL GREEN STERILE (TOWEL DISPOSABLE) ×3 IMPLANT
TOWEL GREEN STERILE FF (TOWEL DISPOSABLE) ×3 IMPLANT
TUBE CONNECTING 12'X1/4 (SUCTIONS) ×1
TUBE CONNECTING 12X1/4 (SUCTIONS) ×2 IMPLANT
WATER STERILE IRR 1000ML POUR (IV SOLUTION) ×3 IMPLANT
YANKAUER SUCT BULB TIP NO VENT (SUCTIONS) ×3 IMPLANT

## 2019-01-18 NOTE — ED Notes (Signed)
Pt changed into gown.

## 2019-01-18 NOTE — ED Notes (Signed)
XR at bedside

## 2019-01-18 NOTE — Progress Notes (Signed)
Office Visit Note   Patient: Rita Long           Date of Birth: 02/21/1991           MRN: [id number] Visit Date: 01/18/2019              Requested by: Delsa Bern, MD [address] [place],  [place] [id number] PCP: Delsa Bern, MD   Assessment & Plan: Visit Diagnoses:  1. Closed bimalleolar fracture of right ankle, initial encounter     Plan: Despite the reduction patient still has residual subluxation of the ankle.  We will proceed with ORIF right ankle.  We discussed lateral fibular plating possible leg screw for the posterior malleolus.  Medial deltoid side opening only if reduction is prevented in case some of the ligament is inside the joint preventing normal reduction.  Questions were elicited and answered she and her mother understand and requests we proceed.  Follow-Up Instructions: No follow-ups on file.   Orders:  No orders of the defined types were placed in this encounter.  No orders of the defined types were placed in this encounter.     Procedures: No procedures performed   Clinical Data: No additional findings.   Subjective: Chief Complaint  Patient presents with  . Right Ankle - Fracture    DOI 01/18/2019    HPI 28 year old healthy female twisted her ankle while trying to get out of bed suffered a deltoid ligament rupture and bimalleolar ankle fracture involving lateral malleolus posterior malleolus with ankle dislocation.  She is unable to ambulate seen in emergency room by Dr. Leonides Schanz and underwent several attempts at closed reduction.  She could get reduced but then by the time the splint was applied her ankle is subluxed posteriorly.  She received fentanyl and ketamine.  Patient's been active healthy she has short stature and 5 feet tall.  Patient is here with her mother and I also talked with her sister who works at [employer] in [place].  Review of Systems patient has  a 30-year-old son. She has been active  healthy.  Review of systems otherwise is -14 point system.   Objective: Vital Signs: Ht 5' (1.524 m)   Wt 164 lb (74.4 kg)   BMI 32.03 kg/m   Physical Exam Constitutional:      Appearance: She is well-developed.  HENT:     Head: Normocephalic.     Right Ear: External ear normal.     Left Ear: External ear normal.  Eyes:     Pupils: Pupils are equal, round, and reactive to light.  Neck:     Thyroid: No thyromegaly.     Trachea: No tracheal deviation.  Cardiovascular:     Rate and Rhythm: Normal rate.  Pulmonary:     Effort: Pulmonary effort is normal.  Abdominal:     Palpations: Abdomen is soft.  Skin:    General: Skin is warm and dry.  Neurological:     Mental Status: She is alert and oriented to person, place, and time.  Psychiatric:        Behavior: Behavior normal.     Ortho Exam patient is in a well-padded splint has intact sensation of the toes.  Patient has crutches but is unsteady with them and is in a wheelchair.  Specialty Comments:  No specialty comments available.  Imaging: Dg Ankle 2 Views Right  Result Date: 01/18/2019 CLINICAL DATA:  Ankle reduction EXAM: RIGHT ANKLE - 2 VIEW COMPARISON:  Ankle radiographs from earlier the same day, most recently 4:12 a.m. FINDINGS: Posterior malleolus and distal fibular fracture with unchanged posterior dislocation of the talus and medial clear space widening IMPRESSION: Ankle fractures with unchanged posterior displacement of the talus and medial clear space widening. Electronically Signed   By: Monte Fantasia M.D.   On: 01/18/2019 07:39   Dg Ankle Complete Right  Result Date: 01/18/2019 CLINICAL DATA:  Post reduction of ankle fracture EXAM: RIGHT ANKLE - COMPLETE 3+ VIEW COMPARISON:  Earlier today FINDINGS: Distal fibular and posterior malleolus fractures with medial ligamentous disruption. There is persistent posterior dislocation of the ankle. No new fracture. IMPRESSION: Improved alignment but persistent posterior  dislocation of the ankle. Distal fibular and posterior malleolus fractures. Electronically Signed   By: Monte Fantasia M.D.   On: 01/18/2019 05:02   Dg Ankle Complete Right  Result Date: 01/18/2019 CLINICAL DATA:  Recent fall with ankle deformity EXAM: RIGHT ANKLE - COMPLETE 3+ VIEW COMPARISON:  None. FINDINGS: Oblique fracture through the distal fibula is noted. There is lateral displacement of the talus with respect to the distal tibia identified. Posterior malleolar fracture is noted as well. No definitive medial malleolar fracture is noted. IMPRESSION: Bimalleolar fracture with lateral subluxation of the talus with respect to the distal tibia. Electronically Signed   By: [name] M.D.   On: 01/18/2019 03:25   Dg Foot 2 Views Right  Result Date: 01/18/2019 CLINICAL DATA:  Recent fall with ankle deformity, initial encounter EXAM: RIGHT FOOT - 2 VIEW COMPARISON:  None. FINDINGS: Bimalleolar fracture is again identified with posterior and lateral subluxation of the talus with respect to the distal tibia. No other fractures are seen. IMPRESSION: Bimalleolar fracture with talar subluxation as described. Electronically Signed   By: [name] M.D.   On: 01/18/2019 03:26     PMFS History: Patient Active Problem List   Diagnosis Date Noted  . Bimalleolar fracture of right ankle 01/18/2019  . Fibroid 09/23/2011  . Short stature 09/23/2011   Past Medical History:  Diagnosis Date  . Fibroid    "unsure"    Family History  Problem Relation Age of Onset  . Hypertension Mother   . Anesthesia problems Neg Hx   . Hypotension Neg Hx   . Malignant hyperthermia Neg Hx   . Pseudochol deficiency Neg Hx     Past Surgical History:  Procedure Laterality Date  . CESAREAN SECTION  07/01/2011   Procedure: CESAREAN SECTION;  Surgeon: Alwyn Pea, MD;  Location: [facility];  Service: Gynecology;  Laterality: N/A;   Social History   Occupational History  . Not on file  Tobacco Use  . Smoking  status: Former Smoker    Packs/day: 0.25    Quit date: 06/22/2010    Years since quitting: 8.5  . Smokeless tobacco: Never Used  . Tobacco comment: not currently smoking or using marijuana  Substance and Sexual Activity  . Alcohol use: No  . Drug use: No    Types: Marijuana, Other-see comments    Comment: pt states no drugs at this visit  . Sexual activity: Not Currently    Birth control/protection: I.U.D.    Comment: Mirena 09-08-11

## 2019-01-18 NOTE — Discharge Instructions (Addendum)
You have fractured your ankle in multiple places and have a persistent dislocation of the ankle and will need surgery.  Please call Dr. Lorin Mercy office this morning to schedule an appointment.  He is aware of your injury and aware of the persistent dislocation.  Please keep your splint on and keep it clean and dry at all times.  Please do not bear any weight on this leg.   You are being provided a prescription for opiates (also known as narcotics) for pain control.  Opiates can be addictive and should only be used when absolutely necessary for pain control when other alternatives do not work.  We recommend you only use them for the recommended amount of time and only as prescribed.  Please do not take with other sedative medications or alcohol.  Please do not drive, operate machinery, make important decisions while taking opiates.  Please note that these medications can be addictive and have high abuse potential.  Patients can become addicted to narcotics after only taking them for a few days.  Please keep these medications locked away from children, teenagers or any family members with history of substance abuse.  Narcotic pain medicine may also make you constipated.  You may use over-the-counter medications such as MiraLAX, Colace to prevent constipation.  If you become constipated you may use over-the-counter enemas as needed.  Itching and nausea are common side effects of narcotic pain medication.  If you develop uncontrolled vomiting or a rash, please stop these medications.

## 2019-01-18 NOTE — H&P (View-Only) (Signed)
Office Visit Note   Patient: Rita Long           Date of Birth: 02/02/1991           MRN: [id number] Visit Date: 01/18/2019              Requested by: Delsa Bern, MD [address] [place],  [place] [address] PCP: Delsa Bern, MD   Assessment & Plan: Visit Diagnoses:  1. Closed bimalleolar fracture of right ankle, initial encounter     Plan: Despite the reduction patient still has residual subluxation of the ankle.  We will proceed with ORIF right ankle.  We discussed lateral fibular plating possible leg screw for the posterior malleolus.  Medial deltoid side opening only if reduction is prevented in case some of the ligament is inside the joint preventing normal reduction.  Questions were elicited and answered she and her mother understand and requests we proceed.  Follow-Up Instructions: No follow-ups on file.   Orders:  No orders of the defined types were placed in this encounter.  No orders of the defined types were placed in this encounter.     Procedures: No procedures performed   Clinical Data: No additional findings.   Subjective: Chief Complaint  Patient presents with  . Right Ankle - Fracture    DOI 01/18/2019    HPI 28 year old healthy female twisted her ankle while trying to get out of bed suffered a deltoid ligament rupture and bimalleolar ankle fracture involving lateral malleolus posterior malleolus with ankle dislocation.  She is unable to ambulate seen in emergency room by Dr. Leonides Schanz and underwent several attempts at closed reduction.  She could get reduced but then by the time the splint was applied her ankle is subluxed posteriorly.  She received fentanyl and ketamine.  Patient's been active healthy she has short stature and 5 feet tall.  Patient is here with her mother and I also talked with her sister who works at [employer] in [place].  Review of Systems patient has  a 4-year-old son. She has been active  healthy.  Review of systems otherwise is -14 point system.   Objective: Vital Signs: Ht 5' (1.524 m)   Wt 164 lb (74.4 kg)   BMI 32.03 kg/m   Physical Exam Constitutional:      Appearance: She is well-developed.  HENT:     Head: Normocephalic.     Right Ear: External ear normal.     Left Ear: External ear normal.  Eyes:     Pupils: Pupils are equal, round, and reactive to light.  Neck:     Thyroid: No thyromegaly.     Trachea: No tracheal deviation.  Cardiovascular:     Rate and Rhythm: Normal rate.  Pulmonary:     Effort: Pulmonary effort is normal.  Abdominal:     Palpations: Abdomen is soft.  Skin:    General: Skin is warm and dry.  Neurological:     Mental Status: She is alert and oriented to person, place, and time.  Psychiatric:        Behavior: Behavior normal.     Ortho Exam patient is in a well-padded splint has intact sensation of the toes.  Patient has crutches but is unsteady with them and is in a wheelchair.  Specialty Comments:  No specialty comments available.  Imaging: Dg Ankle 2 Views Right  Result Date: 01/18/2019 CLINICAL DATA:  Ankle reduction EXAM: RIGHT ANKLE - 2 VIEW COMPARISON:  Ankle radiographs from earlier the same day, most recently 4:12 a.m. FINDINGS: Posterior malleolus and distal fibular fracture with unchanged posterior dislocation of the talus and medial clear space widening IMPRESSION: Ankle fractures with unchanged posterior displacement of the talus and medial clear space widening. Electronically Signed   By: Monte Fantasia M.D.   On: 01/18/2019 07:39   Dg Ankle Complete Right  Result Date: 01/18/2019 CLINICAL DATA:  Post reduction of ankle fracture EXAM: RIGHT ANKLE - COMPLETE 3+ VIEW COMPARISON:  Earlier today FINDINGS: Distal fibular and posterior malleolus fractures with medial ligamentous disruption. There is persistent posterior dislocation of the ankle. No new fracture. IMPRESSION: Improved alignment but persistent posterior  dislocation of the ankle. Distal fibular and posterior malleolus fractures. Electronically Signed   By: Monte Fantasia M.D.   On: 01/18/2019 05:02   Dg Ankle Complete Right  Result Date: 01/18/2019 CLINICAL DATA:  Recent fall with ankle deformity EXAM: RIGHT ANKLE - COMPLETE 3+ VIEW COMPARISON:  None. FINDINGS: Oblique fracture through the distal fibula is noted. There is lateral displacement of the talus with respect to the distal tibia identified. Posterior malleolar fracture is noted as well. No definitive medial malleolar fracture is noted. IMPRESSION: Bimalleolar fracture with lateral subluxation of the talus with respect to the distal tibia. Electronically Signed   By: [name] M.D.   On: 01/18/2019 03:25   Dg Foot 2 Views Right  Result Date: 01/18/2019 CLINICAL DATA:  Recent fall with ankle deformity, initial encounter EXAM: RIGHT FOOT - 2 VIEW COMPARISON:  None. FINDINGS: Bimalleolar fracture is again identified with posterior and lateral subluxation of the talus with respect to the distal tibia. No other fractures are seen. IMPRESSION: Bimalleolar fracture with talar subluxation as described. Electronically Signed   By: [name] M.D.   On: 01/18/2019 03:26     PMFS History: Patient Active Problem List   Diagnosis Date Noted  . Bimalleolar fracture of right ankle 01/18/2019  . Fibroid 09/23/2011  . Short stature 09/23/2011   Past Medical History:  Diagnosis Date  . Fibroid    "unsure"    Family History  Problem Relation Age of Onset  . Hypertension Mother   . Anesthesia problems Neg Hx   . Hypotension Neg Hx   . Malignant hyperthermia Neg Hx   . Pseudochol deficiency Neg Hx     Past Surgical History:  Procedure Laterality Date  . CESAREAN SECTION  07/01/2011   Procedure: CESAREAN SECTION;  Surgeon: Alwyn Pea, MD;  Location: [facility];  Service: Gynecology;  Laterality: N/A;   Social History   Occupational History  . Not on file  Tobacco Use  . Smoking  status: Former Smoker    Packs/day: 0.25    Quit date: 06/22/2010    Years since quitting: 8.5  . Smokeless tobacco: Never Used  . Tobacco comment: not currently smoking or using marijuana  Substance and Sexual Activity  . Alcohol use: No  . Drug use: No    Types: Marijuana, Other-see comments    Comment: pt states no drugs at this visit  . Sexual activity: Not Currently    Birth control/protection: I.U.D.    Comment: Mirena 09-08-11

## 2019-01-18 NOTE — ED Notes (Addendum)
Ortho tech paged to come to [facility] for sedation

## 2019-01-18 NOTE — Anesthesia Procedure Notes (Signed)
Procedure Name: LMA Insertion Date/Time: 01/18/2019 6:28 PM Performed by: Clovis Cao, CRNA Pre-anesthesia Checklist: Patient identified, Emergency Drugs available, Suction available, Patient being monitored and Timeout performed Patient Re-evaluated:Patient Re-evaluated prior to induction Oxygen Delivery Method: Circle system utilized Preoxygenation: Pre-oxygenation with 100% oxygen Induction Type: IV induction Ventilation: Mask ventilation without difficulty LMA: LMA inserted LMA Size: 4.0 Tube type: Oral Placement Confirmation: positive ETCO2 and breath sounds checked- equal and bilateral Tube secured with: Tape Dental Injury: Teeth and Oropharynx as per pre-operative assessment

## 2019-01-18 NOTE — Sedation Documentation (Signed)
Ortho tech splinted right ankle

## 2019-01-18 NOTE — ED Notes (Addendum)
While attempting to get pt changed into gown, pt stated multiple times "no I am in too much pain to think about doing that." Pt informed of why staff needs to have pt in gown and pt still refused. Pt placed on cardiac monitor. Crash cart at bedside and room prepped for sedation.

## 2019-01-18 NOTE — ED Notes (Addendum)
Before RN could triage pt, pt screaming out "I NEED SOMETHING FOR PAIN NOW. When are you giving me something for pain." Rn informed pt that medication cannot be administered without a provider's order. Pt not happy with statement.

## 2019-01-18 NOTE — Progress Notes (Signed)
Orthopedic Tech Progress Note Patient Details:  Rita Long May 08, 1991 [id number]  Ortho Devices Type of Ortho Device: Ace wrap, Stirrup splint, Post (short leg) splint Ortho Device/Splint Location: right Ortho Device/Splint Interventions: Application   Post Interventions Patient Tolerated: Well Instructions Provided: Care of device   [name] 01/18/2019, 6:53 AM

## 2019-01-18 NOTE — Anesthesia Preprocedure Evaluation (Addendum)
Anesthesia Evaluation  Patient identified by MRN, date of birth, ID band Patient awake    Reviewed: Allergy & Precautions, NPO status , Patient's Chart, lab work & pertinent test results  Airway Mallampati: I  TM Distance: >3 FB Neck ROM: Full    Dental  (+) Teeth Intact, Dental Advisory Given   Pulmonary former smoker,    breath sounds clear to auscultation       Cardiovascular negative cardio ROS   Rhythm:Regular Rate:Normal     Neuro/Psych negative neurological ROS  negative psych ROS   GI/Hepatic negative GI ROS, Neg liver ROS,   Endo/Other  negative endocrine ROS  Renal/GU negative Renal ROS     Musculoskeletal negative musculoskeletal ROS (+)   Abdominal Normal abdominal exam  (+)   Peds  Hematology negative hematology ROS (+)   Anesthesia Other Findings   Reproductive/Obstetrics                            Anesthesia Physical Anesthesia Plan  ASA: II  Anesthesia Plan: General   Post-op Pain Management: GA combined w/ Regional for post-op pain   Induction: Intravenous  PONV Risk Score and Plan: 4 or greater and Ondansetron, Dexamethasone, Midazolam and Scopolamine patch - Pre-op  Airway Management Planned: LMA  Additional Equipment: None  Intra-op Plan:   Post-operative Plan: Extubation in OR  Informed Consent: I have reviewed the patients History and Physical, chart, labs and discussed the procedure including the risks, benefits and alternatives for the proposed anesthesia with the patient or authorized representative who has indicated his/her understanding and acceptance.       Plan Discussed with: CRNA  Anesthesia Plan Comments:        Anesthesia Quick Evaluation

## 2019-01-18 NOTE — Op Note (Signed)
Preop diagnosis: Right bimalleolar ankle fracture dislocation, closed.  Postop diagnosis: Same  Procedure: Open reduction internal fixation of lateral malleolus and posterior malleolus right ankle.(ORIF of bimalleolar ankle fracture)  Surgeon: Rodell Perna, MD  Anesthesia: General plus block  Tourniquet 53 minutes x 350  Implants: Biomet composite titanium locking plate laterally.  4 oh partially-threaded lag screw 40 mm for posterior malleolus.  Procedure: This patient had reduction several attempts in the emergency room with conscious sedation and still had subluxation of her ankle due to fracture of the posterior malleolus and distal short oblique fibular fracture.  She was seen today in my office after injury yesterday and scheduled for fixation due to residual ankle subluxation.  Patient was prepped and draped standard fashion proximal thigh tourniquet bone foam was used.  Ancef was given prophylactically.  Extremity sheets and drapes were applied sterile gloves over the toes.  Timeout procedure completed.  Lateral incision was made over the fibula subperiosteal dissection.  There was a very short oblique fracture lower than normal supination external rotation fracture with significant reduction in it took 40minutes will multiple attempts to get near anatomic reduction.  Patient had rupture of the medial deltoid ligament and distal fragment had to be rotated as well as with distraction to get it out to length and finally once it was near anatomic position is clamped down securely and was within 1/2 mm anatomic.  7-hole Biomet composite plate was placed laterally, titanium.  Distal screws were placed nonlocking.  Approximately a combination locking nonlocking screws were used C-arm was checked intermittently with anatomic position and alignment.  Interfrag screw was placed from anterior proximal distal posteriorly which was 20 mm screw.  Distal compression of the fracture nicely.  Slipping behind  the fibula the posterior malleolus fragment could be pushed anteriorly and was doing combination of ankle dorsiflexion plantarflexion was working the fragment with a Soil scientist it was reduced and fairly good position.  Using C arm visualization small stab incision was made anteriorly and K wire was drilled lagging and catching the fragment.  Proximal cortex was drilled and then using the handle of a baby Hohmann the posterior cortex of the distal tibia posterior malleolar fragment was pushed anteriorly as a screw was tightened down power drill used and compressed.  This caught the fragment selected down nicely with 1 to 2 mm of displacement proximally in the joint surface was restored.  Tip of the screws just post poked through the cortex.  Copious irrigation with saline solution.  2-0 Vicryl laterally reapproximating subtenons tissue skin staple closure postop dressing short leg splint application patient tolerated the procedure well will stay overnight for therapy and can be discharged tomorrow when she is mobile with a walker.

## 2019-01-18 NOTE — Progress Notes (Signed)
RT present for the duration of sedation procedure.  Pt remained stable on nasal cannula and EtCO2 monitoring throughout procedure.

## 2019-01-18 NOTE — Anesthesia Procedure Notes (Signed)
Anesthesia Regional Block: Popliteal block   Pre-Anesthetic Checklist: ,, timeout performed, Correct Patient, Correct Site, Correct Laterality, Correct Procedure, Correct Position, site marked, Risks and benefits discussed,  Surgical consent,  Pre-op evaluation,  At surgeon's request and post-op pain management  Laterality: Right  Prep: chloraprep       Needles:  Injection technique: Single-shot  Needle Type: Echogenic Stimulator Needle     Needle Length: 9cm  Needle Gauge: 21     Additional Needles:   Procedures:,,,, ultrasound used (permanent image in chart),,,,  Narrative:  Start time: 01/18/2019 5:40 PM End time: 01/18/2019 5:50 PM Injection made incrementally with aspirations every 5 mL.  Performed by: Personally  Anesthesiologist: Effie Berkshire, MD  Additional Notes: Patient tolerated the procedure well. Local anesthetic introduced in an incremental fashion under minimal resistance after negative aspirations. No paresthesias were elicited. After completion of the procedure, no acute issues were identified and patient continued to be monitored by RN.         R Popliteal, not CVC

## 2019-01-18 NOTE — Progress Notes (Signed)
Orthopedic Tech Progress Note Patient Details:  Rita Long [birth year] [id number]  Ortho Devices Type of Ortho Device: Stirrup splint, Post (short leg) splint Ortho Device/Splint Location: right Ortho Device/Splint Interventions: Application   Post Interventions Patient Tolerated: Well Instructions Provided: Care of device   [name] 01/18/2019, 4:20 AM

## 2019-01-18 NOTE — ED Provider Notes (Addendum)
TIME SEEN: 2:53 AM  CHIEF COMPLAINT: Right ankle injury  HPI: Patient is a 28 year old female with no significant past medical history who presents to the emergency department with a right ankle injury.  States she twisted her ankle while trying to get out of bed.  She did fall to the ground but did not strike her head or lose consciousness.  Not on blood thinners.  No neck or back pain.  No chest or abdominal pain.  Unable to ambulate.  Received fentanyl and ketamine with EMS.  ROS: See HPI Constitutional: no fever  Eyes: no drainage  ENT: no runny nose   Cardiovascular:  no chest pain  Resp: no SOB  GI: no vomiting GU: no dysuria Integumentary: no rash  Allergy: no hives  Musculoskeletal: no leg swelling  Neurological: no slurred speech ROS otherwise negative  PAST MEDICAL HISTORY/PAST SURGICAL HISTORY:  Past Medical History:  Diagnosis Date  . Fibroid    "unsure"    MEDICATIONS:  Prior to Admission medications   Medication Sig Start Date End Date Taking? Authorizing Provider  diazepam (VALIUM) 5 MG tablet Take 5 mg by mouth every 8 (eight) hours as needed. For anxiety.    [provider]  ibuprofen (ADVIL,MOTRIN) 800 MG tablet Take 800 mg by mouth every 8 (eight) hours as needed. For pain    [provider]  levonorgestrel (MIRENA) 20 MCG/24HR IUD 1 each by Intrauterine route once.    [provider]  sertraline (ZOLOFT) 50 MG tablet Take 1 tablet (50 mg total) by mouth daily. 02/23/12 02/22/13  Hazel Sams, PA-C    ALLERGIES:  No Known Allergies  SOCIAL HISTORY:  Social History   Tobacco Use  . Smoking status: Former Smoker    Packs/day: 0.25    Quit date: 06/22/2010    Years since quitting: 8.5  . Smokeless tobacco: Never Used  . Tobacco comment: not currently smoking or using marijuana  Substance Use Topics  . Alcohol use: No    FAMILY HISTORY: Family History  Problem Relation Age of Onset  . Hypertension Mother   . Anesthesia  problems Neg Hx   . Hypotension Neg Hx   . Malignant hyperthermia Neg Hx   . Pseudochol deficiency Neg Hx     EXAM: BP (!) 159/107 (BP Location: Right Arm)   Pulse 82   Temp 98.6 F (37 C) (Oral)   Resp 20   Ht 5' (1.524 m)   Wt 74.4 kg   SpO2 99%   BMI 32.03 kg/m  CONSTITUTIONAL: Alert and oriented and responds appropriately to questions. Well-appearing; well-nourished; GCS 15 HEAD: Normocephalic; atraumatic EYES: Conjunctivae clear, PERRL, EOMI ENT: normal nose; no rhinorrhea; moist mucous membranes; pharynx without lesions noted; no dental injury; no septal hematoma NECK: Supple, no meningismus, no LAD; no midline spinal tenderness, step-off or deformity; trachea midline CARD: RRR; S1 and S2 appreciated; no murmurs, no clicks, no rubs, no gallops RESP: Normal chest excursion without splinting or tachypnea; breath sounds clear and equal bilaterally; no wheezes, no rhonchi, no rales; no hypoxia or respiratory distress CHEST:  chest wall stable, no crepitus or ecchymosis or deformity, nontender to palpation; no flail chest ABD/GI: Normal bowel sounds; non-distended; soft, non-tender, no rebound, no guarding; no ecchymosis or other lesions noted PELVIS:  stable, nontender to palpation BACK:  The back appears normal and is non-tender to palpation, there is no CVA tenderness; no midline spinal tenderness, step-off or deformity EXT: Obvious deformity to the right ankle.  No  skin tenting.  2+ DP pulse on exam.  No tenderness over the proximal right fibular head.  Otherwise right lower extremity is nontender to palpation.  She is able to move her toes normally.  She reports normal sensation diffusely.  Compartments in the right leg are soft.  No other extremity injury noted. SKIN: Normal color for age and race; warm NEURO: Moves all extremities equally PSYCH: The patient's mood and manner are appropriate. Grooming and personal hygiene are appropriate.  MEDICAL DECISION MAKING: Patient here  with bimalleolar fracture and lateral subluxation of the talus.  Patient will need reduction and splinting.  She does not have a local orthopedist.  ED PROGRESS: Lateral subluxation has improved on postreduction x-rays however she has persistent posterior dislocation likely secondary to her posterior malleolus fragment.  Discussed with Dr. Lorin Mercy on call for orthopedics.  He recommends repeating reduction this time with the foot in plantar flexion and significant amount of padding on the heel.  Patient updated with this plan.  She has asked me to contact her sister.  I have called her sister but there was no answer.   Repeat reduction was unsuccessful.  She still has persistent posterior dislocation likely secondary to large posterior fragment.  Dr. Lorin Mercy with orthopedics aware.  He recommends outpatient follow-up for surgical intervention.  Patient still neurovascularly intact.  Pain is been controlled with Dilaudid.  Will discharge with Percocet.  She knows to be nonweightbearing at this time.  She will call Dr. Lorin Mercy office in the morning for an appointment.   At this time, I do not feel there is any life-threatening condition present. I have reviewed and discussed all results (EKG, imaging, lab, urine as appropriate) and exam findings with patient/family. I have reviewed nursing notes and appropriate previous records.  I feel the patient is safe to be discharged home without further emergent workup and can continue workup as an outpatient as needed. Discussed usual and customary return precautions. Patient/family verbalize understanding and are comfortable with this plan.  Outpatient follow-up has been provided as needed. All questions have been answered.    .Sedation  Date/Time: 01/18/2019 4:23 AM Performed by: Pessy Delamar, Delice Bison, DO Authorized by: Avelyn Touch, Delice Bison, DO   Consent:    Consent obtained:  Written   Consent given by:  Patient   Risks discussed:  Allergic reaction, dysrhythmia,  inadequate sedation, nausea, prolonged hypoxia resulting in organ damage, prolonged sedation necessitating reversal, respiratory compromise necessitating ventilatory assistance and intubation and vomiting   Alternatives discussed:  Analgesia without sedation, anxiolysis and regional anesthesia Universal protocol:    Procedure explained and questions answered to patient or proxy's satisfaction: yes     Relevant documents present and verified: yes     Test results available and properly labeled: yes     Imaging studies available: yes     Required blood products, implants, devices, and special equipment available: yes     Site/side marked: yes     Immediately prior to procedure a time out was called: yes     Patient identity confirmation method:  Verbally with patient Indications:    Procedure necessitating sedation performed by:  Physician performing sedation Pre-sedation assessment:    Time since last food or drink:  12am   ASA classification: class 1 - normal, healthy patient     Neck mobility: normal     Mouth opening:  3 or more finger widths   Thyromental distance:  4 finger widths   Mallampati score:  I -  soft palate, uvula, fauces, pillars visible   Pre-sedation assessments completed and reviewed: airway patency, cardiovascular function, hydration status, mental status, nausea/vomiting, pain level, respiratory function and temperature     Pre-sedation assessment completed:  01/18/2019 3:30 AM Immediate pre-procedure details:    Reassessment: Patient reassessed immediately prior to procedure     Reviewed: vital signs, relevant labs/tests and NPO status     Verified: bag valve mask available, emergency equipment available, intubation equipment available, IV patency confirmed, oxygen available and suction available   Procedure details (see MAR for exact dosages):    Preoxygenation:  Nasal cannula   Sedation:  Propofol   Intra-procedure monitoring:  Blood pressure monitoring, cardiac  monitor, continuous pulse oximetry, frequent LOC assessments, frequent vital sign checks and continuous capnometry   Intra-procedure events: none     Total Provider sedation time (minutes):  30 Post-procedure details:    Post-sedation assessment completed:  01/18/2019 6:35 AM   Attendance: Constant attendance by certified staff until patient recovered     Recovery: Patient returned to pre-procedure baseline     Post-sedation assessments completed and reviewed: airway patency, cardiovascular function, hydration status, mental status, nausea/vomiting, pain level, respiratory function and temperature     Patient is stable for discharge or admission: yes     Patient tolerance:  Tolerated well, no immediate complications Reduction of dislocation  Date/Time: 01/18/2019 4:23 AM Performed by: Dyamon Sosinski, Delice Bison, DO Authorized by: Keylie Beavers, Delice Bison, DO  Consent: Written consent obtained. Consent given by: patient Patient understanding: patient states understanding of the procedure being performed Patient consent: the patient's understanding of the procedure matches consent given Procedure consent: procedure consent matches procedure scheduled Relevant documents: relevant documents present and verified Test results: test results available and properly labeled Site marked: the operative site was marked Imaging studies: imaging studies available Required items: required blood products, implants, devices, and special equipment available Patient identity confirmed: verbally with patient Time out: Immediately prior to procedure a "time out" was called to verify the correct patient, procedure, equipment, support staff and site/side marked as required. Local anesthesia used: no  Anesthesia: Local anesthesia used: no  Sedation: Patient sedated: yes Sedatives: propofol Analgesia: hydromorphone Vitals: Vital signs were monitored during sedation.  Patient tolerance: patient tolerated the procedure well with  no immediate complications Comments: Sedation and reduction performed twice.  Please see nursing notes for start and end time of both sedations.    SPLINT APPLICATION Date/Time: 9:38 AM Authorized by: Cyril Mourning Jantzen Pilger Consent: Verbal consent obtained. Risks and benefits: risks, benefits and alternatives were discussed Consent given by: patient Splint applied by: orthopedic technician Location details: right leg Splint type: posterior short leg with stirrups Supplies used: fiberglass Post-procedure: The splinted body part was neurovascularly unchanged following the procedure. Patient tolerance: Patient tolerated the procedure well with no immediate complications.        Fronie Holstein, Delice Bison, DO 01/18/19 0732    Vonzell Lindblad, Delice Bison, DO 01/18/19 2319

## 2019-01-18 NOTE — Anesthesia Postprocedure Evaluation (Signed)
Anesthesia Post Note  Patient: Rita Long  Procedure(s) Performed: OPEN REDUCTION INTERNAL FIXATION (ORIF) RIGHT ANKLE( BI-MALEOLAR-POSTERIOR AND LATERAL) (Right Ankle)     Patient location during evaluation: PACU Anesthesia Type: General Level of consciousness: awake and alert Pain management: pain level controlled Vital Signs Assessment: post-procedure vital signs reviewed and stable Respiratory status: spontaneous breathing, nonlabored ventilation, respiratory function stable and patient connected to nasal cannula oxygen Cardiovascular status: blood pressure returned to baseline and stable Postop Assessment: no apparent nausea or vomiting Anesthetic complications: no    Last Vitals:  Vitals:   01/18/19 2021 01/18/19 2039  BP: 126/82 126/88  Pulse: 86 91  Resp: 16 15  Temp:  36.8 C  SpO2: 100% 98%    Last Pain:  Vitals:   01/18/19 2039  TempSrc: Oral  PainSc:                  Valaree Fresquez DAVID

## 2019-01-18 NOTE — ED Notes (Signed)
Pt refuses to take clothes off and be placed in a gown. Pt aware she is being sedated and has been explained the importance of having a hospital gown on. Pt yelled at writer "I'm not worried about taking my clothes off because I'm in too much pain right now". Sarah RN at bedside

## 2019-01-18 NOTE — Interval H&P Note (Signed)
History and Physical Interval Note:  01/18/2019 6:03 PM  Rita Long  has presented today for surgery, with the diagnosis of right bimalleolar ankle fracture disclocation.  The various methods of treatment have been discussed with the patient and family. After consideration of risks, benefits and other options for treatment, the patient has consented to  Procedure(s) with comments: OPEN REDUCTION INTERNAL FIXATION (ORIF) RIGHT ANKLE (Right) - POPLITEAL BLOCK as a surgical intervention.  The patient's history has been reviewed, patient examined, no change in status, stable for surgery.  I have reviewed the patient's chart and labs.  Questions were answered to the patient's satisfaction.     Marybelle Killings

## 2019-01-18 NOTE — Transfer of Care (Signed)
Immediate Anesthesia Transfer of Care Note  Patient: Rita Long  Procedure(s) Performed: OPEN REDUCTION INTERNAL FIXATION (ORIF) RIGHT ANKLE( BI-MALEOLAR-POSTERIOR AND LATERAL) (Right Ankle)  Patient Location: PACU  Anesthesia Type:General  Level of Consciousness: drowsy  Airway & Oxygen Therapy: Patient Spontanous Breathing  Post-op Assessment: Report given to RN and Post -op Vital signs reviewed and stable  Post vital signs: Reviewed and stable  Last Vitals:  Vitals Value Taken Time  BP 124/81 01/18/19 1951  Temp 36.1 C 01/18/19 1952  Pulse 89 01/18/19 1956  Resp 15 01/18/19 1956  SpO2 98 % 01/18/19 1956  Vitals shown include unvalidated device data.  Last Pain:  Vitals:   01/18/19 1952  TempSrc:   PainSc: Asleep      Patients Stated Pain Goal: 3 (08/12/38 6986)  Complications: No apparent anesthesia complications

## 2019-01-18 NOTE — ED Notes (Signed)
Pt aware of discharge instructions and the importance of not placing weight on her ankle until she follows up with Dr Lorin Mercy as instructed.

## 2019-01-18 NOTE — Progress Notes (Signed)
Orthopedic Tech Progress Note Patient Details:  Rita Long 1991/01/30 [id number]  Patient ID: Erin Hearing, female   DOB: 1990/11/05, 28 y.o.   MRN: [id number]   [name] 01/18/2019, 6:48 AMRemoved first splint because first ankle reduction failed. Applied second splint.

## 2019-01-18 NOTE — Progress Notes (Signed)
Plan overnight obs.     PT in AM for walker ambulation, not good with crutches, NWB,   Discharge home after walker instruction.  Sister may have brought walker from [place].

## 2019-01-18 NOTE — ED Notes (Signed)
Pt instructed to use crutches and then able to demonstrate for a couple of steps,

## 2019-01-18 NOTE — Sedation Documentation (Signed)
Ortho tech splinted right ankle. XR ordered to verify

## 2019-01-18 NOTE — ED Notes (Signed)
Ortho tech at bedside. Resp called

## 2019-01-18 NOTE — Anesthesia Procedure Notes (Signed)
Anesthesia Regional Block: Adductor canal block   Pre-Anesthetic Checklist: ,, timeout performed, Correct Patient, Correct Site, Correct Laterality, Correct Procedure, Correct Position, site marked, Risks and benefits discussed,  Surgical consent,  Pre-op evaluation,  At surgeon's request and post-op pain management  Laterality: Right  Prep: chloraprep       Needles:  Injection technique: Single-shot  Needle Type: Echogenic Stimulator Needle     Needle Length: 9cm  Needle Gauge: 21     Additional Needles:   Procedures:,,,, ultrasound used (permanent image in chart),,,,  Narrative:  Start time: 01/18/2019 5:50 PM End time: 01/18/2019 5:55 PM Injection made incrementally with aspirations every 5 mL.  Performed by: Personally  Anesthesiologist: Effie Berkshire, MD  Additional Notes: Patient tolerated the procedure well. Local anesthetic introduced in an incremental fashion under minimal resistance after negative aspirations. No paresthesias were elicited. After completion of the procedure, no acute issues were identified and patient continued to be monitored by RN.

## 2019-01-18 NOTE — Interval H&P Note (Signed)
History and Physical Interval Note:  01/18/2019 5:45 PM  Rita Long  has presented today for surgery, with the diagnosis of right bimalleolar ankle fracture disclocation.  The various methods of treatment have been discussed with the patient and family. After consideration of risks, benefits and other options for treatment, the patient has consented to  Procedure(s) with comments: OPEN REDUCTION INTERNAL FIXATION (ORIF) RIGHT ANKLE (Right) - POPLITEAL BLOCK as a surgical intervention.  The patient's history has been reviewed, patient examined, no change in status, stable for surgery.  I have reviewed the patient's chart and labs.  Questions were answered to the patient's satisfaction.     Marybelle Killings

## 2019-01-18 NOTE — ED Triage Notes (Signed)
Per ems: Pt coming from home c/o right ankle injury after falling while getting out of her bed. Pt unable to tell if she stepped on something or just fell. Pulses are present and able to wiggle her toes. No LOC   100 mcg fentanyl and 15mg  of ketamine given by ems

## 2019-01-19 ENCOUNTER — Encounter (HOSPITAL_COMMUNITY): Payer: Self-pay | Admitting: Orthopaedic Surgery

## 2019-01-19 MED ORDER — OXYCODONE-ACETAMINOPHEN 5-325 MG PO TABS: 1.0000 | ORAL_TABLET | Freq: Four times a day (QID) | ORAL | 0 refills | Status: AC | PRN

## 2019-01-19 MED ORDER — ASPIRIN EC 325 MG PO TBEC: 325.0000 mg | DELAYED_RELEASE_TABLET | Freq: Every day | ORAL | 0 refills | Status: AC

## 2019-01-19 NOTE — Progress Notes (Signed)
Patient discharging home. Discharge instructions explained to patient and she verbalized understanding. Took all her personal belongings. No further questions or concerns voiced.  

## 2019-01-19 NOTE — Discharge Instructions (Signed)
Do not remove dressing or get wet.  Strict non-weightbearing right leg until further notice.    Elevate foot above heart level as much as possible to decrease swelling and pain.  Ice of and on prn.  No driving.

## 2019-01-19 NOTE — Evaluation (Signed)
Physical Therapy Evaluation & Discharge Patient Details Name: Rita Long MRN: [id number] DOB: Jan 07, 1991 Today's Date: 01/19/2019   History of Present Illness  Pt is a 28 y/o female s/p R ankle ORIF. No pertinent PMH.  Clinical Impression  Pt presented supine in bed with HOB elevated, awake and willing to participate in therapy session. Prior to admission, pt reported that she was independent with all functional mobility and ADLs. Pt has a 26 year old son and works from home for [employer]. Pt will be staying at her parent's home upon d/c where she will have 24/7 supervision/assistance. At the time of evaluation, pt overall at supervision level for mobility with use of RW to ambulate. Pt able to maintain NWB R LE independently throughout. All education provided and PT answered all questions at end of session. No further acute PT needs identified at this time. PT signing off.     Follow Up Recommendations No PT follow up;Supervision for mobility/OOB    Equipment Recommendations  Rolling walker with 5" wheels  (YOUTH SIZE)   Recommendations for Other Services       Precautions / Restrictions Precautions Precautions: Fall Restrictions Weight Bearing Restrictions: Yes RLE Weight Bearing: Non weight bearing      Mobility  Bed Mobility Overal bed mobility: Needs Assistance Bed Mobility: Supine to Sit;Sit to Supine     Supine to sit: Supervision Sit to supine: Supervision   General bed mobility comments: supervision for safety, HOB elevated  Transfers Overall transfer level: Needs assistance Equipment used: Rolling walker (2 wheeled) Transfers: Sit to/from Stand Sit to Stand: Supervision         General transfer comment: for safety, cueing for technique  Ambulation/Gait Ambulation/Gait assistance: Min guard;Supervision Gait Distance (Feet): 100 Feet(100' x2 with sitting rest break) Assistive device: Rolling walker (2 wheeled) Gait Pattern/deviations: (hop-to on L  LE) Gait velocity: decreased   General Gait Details: pt generally steady with RW, initially min guard and progressing to supervision  Stairs Stairs: Yes       General stair comments: PT demonstrated and instructed pt in ascending/descending steps with RW and in seated position; provided handout  Wheelchair Mobility    Modified Rankin (Stroke Patients Only)       Balance Overall balance assessment: Needs assistance Sitting-balance support: Feet supported Sitting balance-Leahy Scale: Good     Standing balance support: Bilateral upper extremity supported;Single extremity supported Standing balance-Leahy Scale: Poor                               Pertinent Vitals/Pain Pain Assessment: Faces Faces Pain Scale: Hurts little more Pain Location: R ankle Pain Descriptors / Indicators: Sore Pain Intervention(s): Monitored during session;Repositioned    Home Living Family/patient expects to be discharged to:: Private residence Living Arrangements: Children(78 year old son) Available Help at Discharge: Family;Available 24 hours/day Type of Home: House Home Access: Stairs to enter Entrance Stairs-Rails: None Entrance Stairs-Number of Steps: 3 Home Layout: One level Home Equipment: Shower seat;Crutches Additional Comments: plans to go to her parent's home    Prior Function Level of Independence: Independent               Hand Dominance   Dominant Hand: Right    Extremity/Trunk Assessment   Upper Extremity Assessment Upper Extremity Assessment: Overall WFL for tasks assessed;Defer to OT evaluation    Lower Extremity Assessment Lower Extremity Assessment: RLE deficits/detail RLE Deficits / Details: pt with limited AROM  and decreased strength secondary post-op pain and weakness RLE: Unable to fully assess due to immobilization       Communication   Communication: No difficulties  Cognition Arousal/Alertness: Awake/alert Behavior During Therapy:  WFL for tasks assessed/performed Overall Cognitive Status: Within Functional Limits for tasks assessed                                        General Comments      Exercises     Assessment/Plan    PT Assessment Patent does not need any further PT services  PT Problem List         PT Treatment Interventions      PT Goals (Current goals can be found in the Care Plan section)  Acute Rehab PT Goals Patient Stated Goal: decrease pain PT Goal Formulation: All assessment and education complete, DC therapy Potential to Achieve Goals: Good    Frequency     Barriers to discharge        Co-evaluation PT/OT/SLP Co-Evaluation/Treatment: Yes Reason for Co-Treatment: To address functional/ADL transfers;For patient/therapist safety PT goals addressed during session: Mobility/safety with mobility;Balance;Proper use of DME;Strengthening/ROM         AM-PAC PT "6 Clicks" Mobility  Outcome Measure Help needed turning from your back to your side while in a flat bed without using bedrails?: None Help needed moving from lying on your back to sitting on the side of a flat bed without using bedrails?: None Help needed moving to and from a bed to a chair (including a wheelchair)?: None Help needed standing up from a chair using your arms (e.g., wheelchair or bedside chair)?: None Help needed to walk in hospital room?: A Little Help needed climbing 3-5 steps with a railing? : A Little 6 Click Score: 22    End of Session Equipment Utilized During Treatment: Gait belt Activity Tolerance: Patient tolerated treatment well Patient left: in bed;with call bell/phone within reach;with family/visitor present Nurse Communication: Mobility status PT Visit Diagnosis: Other abnormalities of gait and mobility (R26.89)    Time: 9629-5284 PT Time Calculation (min) (ACUTE ONLY): 26 min   Charges:   PT Evaluation $PT Eval Low Complexity: [place], PT,  DPT  Acute Rehabilitation Services Pager [phone] Office [place] 01/19/2019, 2:46 PM

## 2019-01-19 NOTE — TOC Transition Note (Signed)
Transition of Care [facility]) - CM/SW Discharge Note   Patient Details  Name: Rita Long MRN: [id number] Date of Birth: Oct 15, 1990  [facility]) CM/SW Contact:  Ninfa Meeker, RN Phone Number: [phone] (working remotely) 01/19/2019, 3:46 PM   Clinical Narrative: Pt is a 28 y/o female s/p R ankle ORIF. Case manager ordered RW for discharge, no further needs identified.   Final next level of care: Home/Self Care Barriers to Discharge: No Barriers Identified   Patient Goals and CMS Choice        Discharge Placement                       Discharge Plan and Services   Discharge Planning Services: CM Consult Post Acute Care Choice: Durable Medical Equipment          DME Arranged: Gilford Rile youth DME Agency: [employer] Date DME Agency Contacted: 01/19/19 Time DME Agency Contacted: 1500 Representative spoke with at DME Agency: [place]: NA          Social Determinants of Health ([place]) Interventions     Readmission Risk Interventions No flowsheet data found.

## 2019-01-19 NOTE — Evaluation (Signed)
Occupational Therapy Evaluation and Discharge Patient Details Name: Rita Long MRN: [id number] DOB: 20-Feb-1991 Today's Date: 01/19/2019    History of Present Illness Pt is a 28 y/o female s/p R ankle ORIF. No pertinent PMH.   Clinical Impression   Pt is functioning at supervision level in ADL and mobility with RW. All education completed with pt and her sister verbalizing understanding. No further OT needs.    Follow Up Recommendations  No OT follow up    Equipment Recommendations  None recommended by OT    Recommendations for Other Services       Precautions / Restrictions Precautions Precautions: Fall Restrictions Weight Bearing Restrictions: Yes RLE Weight Bearing: Non weight bearing      Mobility Bed Mobility Overal bed mobility: Needs Assistance Bed Mobility: Supine to Sit;Sit to Supine     Supine to sit: Supervision Sit to supine: Supervision   General bed mobility comments: supervision for safety, HOB elevated  Transfers Overall transfer level: Needs assistance Equipment used: Rolling walker (2 wheeled) Transfers: Sit to/from Stand Sit to Stand: Supervision         General transfer comment: for safety, cueing for technique    Balance Overall balance assessment: Needs assistance Sitting-balance support: Feet supported Sitting balance-Leahy Scale: Good     Standing balance support: Bilateral upper extremity supported;Single extremity supported Standing balance-Leahy Scale: Poor                             ADL either performed or assessed with clinical judgement   ADL                                         General ADL Comments: functionin at a supervision level, educated in compensatory strategies for LB ADL, shower and tub transfers and how to transport items safely with RW     Vision         Perception     Praxis      Pertinent Vitals/Pain Pain Assessment: Faces Faces Pain Scale: Hurts little  more Pain Location: R ankle Pain Descriptors / Indicators: Sore Pain Intervention(s): Monitored during session;Repositioned     Hand Dominance Right   Extremity/Trunk Assessment Upper Extremity Assessment Upper Extremity Assessment: Overall WFL for tasks assessed   Lower Extremity Assessment Lower Extremity Assessment: Defer to PT evaluation RLE Deficits / Details: pt with limited AROM and decreased strength secondary post-op pain and weakness RLE: Unable to fully assess due to immobilization   Cervical / Trunk Assessment Cervical / Trunk Assessment: Normal   Communication Communication Communication: No difficulties   Cognition Arousal/Alertness: Awake/alert Behavior During Therapy: WFL for tasks assessed/performed Overall Cognitive Status: Within Functional Limits for tasks assessed                                     General Comments       Exercises     Shoulder Instructions      Home Living Family/patient expects to be discharged to:: Private residence Living Arrangements: Children(14 year old son) Available Help at Discharge: Family;Available 24 hours/day Type of Home: House Home Access: Stairs to enter [employer] of Steps: 3 Entrance Stairs-Rails: None Home Layout: One level     Bathroom Shower/Tub: Occupational psychologist: Standard  Home Equipment: Shower seat;Crutches   Additional Comments: plans to go to her parent's home      Prior Functioning/Environment Level of Independence: Independent                 OT Problem List:        OT Treatment/Interventions:      OT Goals(Current goals can be found in the care plan section) Acute Rehab OT Goals Patient Stated Goal: decrease pain  OT Frequency:     Barriers to D/C:            Co-evaluation   Reason for Co-Treatment: To address functional/ADL transfers PT goals addressed during session: Mobility/safety with mobility;Balance;Proper use of  DME;Strengthening/ROM        AM-PAC OT "6 Clicks" Daily Activity     Outcome Measure Help from another person eating meals?: None Help from another person taking care of personal grooming?: A Little Help from another person toileting, which includes using toliet, bedpan, or urinal?: A Little Help from another person bathing (including washing, rinsing, drying)?: A Little Help from another person to put on and taking off regular upper body clothing?: None Help from another person to put on and taking off regular lower body clothing?: A Little 6 Click Score: 20   End of Session Nurse Communication: Mobility status  Activity Tolerance: Patient tolerated treatment well Patient left: in bed;with call bell/phone within reach;with family/visitor present  OT Visit Diagnosis: Pain;Other abnormalities of gait and mobility (R26.89)                Time: 1030-1314 OT Time Calculation (min): 27 min Charges:  OT General Charges $OT Visit: 1 Visit OT Evaluation $OT Eval Low Complexity: [place], OTR/L Acute Rehabilitation Services Pager: [phone] Office: [phone]  Malka So 01/19/2019, 3:00 PM

## 2019-01-19 NOTE — Progress Notes (Signed)
Subjective: Patient doing well this morning.  Good pain control.  Compliant with nonweightbearing right lower extremity.   Objective: Vital signs in last 24 hours: Temp:  [97 F (36.1 C)-98.4 F (36.9 C)] 98.1 F (36.7 C) (08/06 0739) Pulse Rate:  [80-97] 97 (08/06 0739) Resp:  [14-31] 16 (08/06 0739) BP: (106-163)/(61-99) 126/76 (08/06 0739) SpO2:  [98 %-100 %] 99 % (08/06 0739) Weight:  [74.4 kg] 74.4 kg (08/05 1645)  Intake/Output from previous day: 08/05 0701 - 08/06 0700 In: 992.6 [I.V.:992.6] Out: 925 [Urine:900; Blood:25] Intake/Output this shift: Total I/O In: 120 [P.O.:120] Out: -   Recent Labs    01/18/19 1654  HGB 14.1   Recent Labs    01/18/19 1654  WBC 8.7  RBC 4.48  HCT 43.2  PLT 301   Recent Labs    01/18/19 1654  NA 137  K 3.8  CL 106  CO2 21*  BUN 6  CREATININE 0.67  GLUCOSE 97  CALCIUM 9.5   No results for input(s): LABPT, INR in the last 72 hours.  Exam Very pleasant female alert and oriented in no acute distress.  Splint and dressing intact right lower extremity.  Decreased in station to light touch over the toes.    Assessment/Plan: We will see how patient moves with therapy this morning.  We will plan to discharge home this afternoon if she does well.  Prescriptions for Percocet for pain and aspirin for postop DVT prophylaxis sent to her pharmacy.  Will need follow-up in 1 week for recheck.     Benjiman Core 01/19/2019, 11:33 AM

## 2019-01-19 NOTE — Plan of Care (Signed)
  Problem: Education: Goal: Knowledge of the prescribed therapeutic regimen will improve Outcome: Progressing   Problem: Activity: Goal: Ability to increase mobility will improve Outcome: Progressing   Problem: Pain Management: Goal: Pain level will decrease with appropriate interventions Outcome: Progressing

## 2019-01-23 ENCOUNTER — Encounter: Payer: Self-pay | Admitting: Orthopaedic Surgery

## 2019-01-24 ENCOUNTER — Ambulatory Visit: Payer: BLUE CROSS/BLUE SHIELD | Admitting: Orthopaedic Surgery

## 2019-01-25 NOTE — Discharge Summary (Signed)
Patient ID: Rita Long MRN: [id number] DOB/AGE: 02/23/1991 28 y.o.  Admit date: 01/18/2019 Discharge date: 01/25/2019  Admission Diagnoses:  Active Problems:   Bimalleolar fracture of right ankle   Closed right ankle fracture   Discharge Diagnoses:  Active Problems:   Bimalleolar fracture of right ankle   Closed right ankle fracture  status post Procedure(s): OPEN REDUCTION INTERNAL FIXATION (ORIF) RIGHT ANKLE( BI-MALEOLAR-POSTERIOR AND LATERAL)  Past Medical History:  Diagnosis Date  . Fibroid    "unsure"    Surgeries: Procedure(s): OPEN REDUCTION INTERNAL FIXATION (ORIF) RIGHT ANKLE( BI-MALEOLAR-POSTERIOR AND LATERAL) on 01/18/2019   Consultants:   Discharged Condition: Improved  Hospital Course: Rita Long is an 28 y.o. female who was admitted 01/18/2019 for operative treatment of ankle fracture. Patient failed conservative treatments (please see the history and physical for the specifics) and had severe unremitting pain that affects sleep, daily activities and work/hobbies. After pre-op clearance, the patient was taken to the operating room on 01/18/2019 and underwent  Procedure(s): OPEN REDUCTION INTERNAL FIXATION (ORIF) RIGHT ANKLE( BI-MALEOLAR-POSTERIOR AND LATERAL).    Patient was given perioperative antibiotics:  Anti-infectives (From admission, onward)   Start     Dose/Rate Route Frequency Ordered Stop   01/19/19 0600  ceFAZolin (ANCEF) IVPB 2g/100 mL premix     2 g 200 mL/hr over 30 Minutes Intravenous On call to O.R. 01/18/19 1635 01/18/19 1828       Patient was given sequential compression devices and early ambulation to prevent DVT.   Patient benefited maximally from hospital stay and there were no complications. At the time of discharge, the patient was urinating/moving their bowels without difficulty, tolerating a regular diet, pain is controlled with oral pain medications and they have been cleared by PT/OT.   Recent vital signs: No data  found.   Recent laboratory studies: No results for input(s): WBC, HGB, HCT, PLT, NA, K, CL, CO2, BUN, CREATININE, GLUCOSE, INR, CALCIUM in the last 72 hours.  Invalid input(s): PT, 2   Discharge Medications:   Allergies as of 01/19/2019   No Known Allergies     Medication List    STOP taking these medications   ondansetron 4 MG disintegrating tablet Commonly known as: Zofran ODT     TAKE these medications   aspirin EC 325 MG tablet Take 1 tablet (325 mg total) by mouth daily.   buPROPion 300 MG 24 hr tablet Commonly known as: WELLBUTRIN XL Take 300 mg by mouth daily.   levonorgestrel 20 MCG/24HR IUD Commonly known as: MIRENA 1 each by Intrauterine route once.   oxyCODONE-acetaminophen 5-325 MG tablet Commonly known as: PERCOCET/ROXICET Take 1-2 tablets by mouth every 6 (six) hours as needed for severe pain. What changed: how much to take   sertraline 50 MG tablet Commonly known as: ZOLOFT Take 1 tablet (50 mg total) by mouth daily.       Diagnostic Studies: Dg Ankle 2 Views Right  Result Date: 01/18/2019 CLINICAL DATA:  Ankle reduction EXAM: RIGHT ANKLE - 2 VIEW COMPARISON:  Ankle radiographs from earlier the same day, most recently 4:12 a.m. FINDINGS: Posterior malleolus and distal fibular fracture with unchanged posterior dislocation of the talus and medial clear space widening IMPRESSION: Ankle fractures with unchanged posterior displacement of the talus and medial clear space widening. Electronically Signed   By: Monte Fantasia M.D.   On: 01/18/2019 07:39   Dg Ankle Complete Right  Result Date: 01/18/2019 CLINICAL DATA:  ORIF right ankle EXAM: RIGHT ANKLE - COMPLETE 3+ VIEW;  DG C-ARM 61-120 MIN COMPARISON:  Ankle radiographs same day FINDINGS: Images demonstrate ORIF of the right ankle fracture with a placement of a side plates cube construct along the distal fibula. A separate posterior anterior fixation screw of the distal fibula and a cannulated partially threaded  screw transfixing posterior malleolar fracture seen on prior radiograph. IMPRESSION: Open reduction internal fixation of lateral and posterior malleolar fractures. Electronically Signed   By: Lovena Le M.D.   On: 01/18/2019 19:47   Dg Ankle Complete Right  Result Date: 01/18/2019 CLINICAL DATA:  Post reduction of ankle fracture EXAM: RIGHT ANKLE - COMPLETE 3+ VIEW COMPARISON:  Earlier today FINDINGS: Distal fibular and posterior malleolus fractures with medial ligamentous disruption. There is persistent posterior dislocation of the ankle. No new fracture. IMPRESSION: Improved alignment but persistent posterior dislocation of the ankle. Distal fibular and posterior malleolus fractures. Electronically Signed   By: Monte Fantasia M.D.   On: 01/18/2019 05:02   Dg Ankle Complete Right  Result Date: 01/18/2019 CLINICAL DATA:  Recent fall with ankle deformity EXAM: RIGHT ANKLE - COMPLETE 3+ VIEW COMPARISON:  None. FINDINGS: Oblique fracture through the distal fibula is noted. There is lateral displacement of the talus with respect to the distal tibia identified. Posterior malleolar fracture is noted as well. No definitive medial malleolar fracture is noted. IMPRESSION: Bimalleolar fracture with lateral subluxation of the talus with respect to the distal tibia. Electronically Signed   By: Rita Catalina M.D.   On: 01/18/2019 03:25   Dg Foot 2 Views Right  Result Date: 01/18/2019 CLINICAL DATA:  Recent fall with ankle deformity, initial encounter EXAM: RIGHT FOOT - 2 VIEW COMPARISON:  None. FINDINGS: Bimalleolar fracture is again identified with posterior and lateral subluxation of the talus with respect to the distal tibia. No other fractures are seen. IMPRESSION: Bimalleolar fracture with talar subluxation as described. Electronically Signed   By: Rita Catalina M.D.   On: 01/18/2019 03:26   Dg C-arm 1-60 Min  Result Date: 01/18/2019 CLINICAL DATA:  ORIF right ankle EXAM: RIGHT ANKLE - COMPLETE 3+ VIEW; DG  C-ARM 61-120 MIN COMPARISON:  Ankle radiographs same day FINDINGS: Images demonstrate ORIF of the right ankle fracture with a placement of a side plates cube construct along the distal fibula. A separate posterior anterior fixation screw of the distal fibula and a cannulated partially threaded screw transfixing posterior malleolar fracture seen on prior radiograph. IMPRESSION: Open reduction internal fixation of lateral and posterior malleolar fractures. Electronically Signed   By: Lovena Le M.D.   On: 01/18/2019 19:47      Follow-up Information    Marybelle Killings, MD. Schedule an appointment as soon as possible for a visit today.   Specialty: Orthopedic Surgery Why: need return office visit one week postop.  Contact information: [place] [address] [phone]           Discharge Plan:  discharge to home  Disposition:     Signed: Benjiman Core  01/25/2019, 8:58 AM

## 2019-01-27 ENCOUNTER — Encounter: Payer: Self-pay | Admitting: Orthopaedic Surgery

## 2019-01-27 ENCOUNTER — Ambulatory Visit (INDEPENDENT_AMBULATORY_CARE_PROVIDER_SITE_OTHER): Payer: BLUE CROSS/BLUE SHIELD | Admitting: Orthopaedic Surgery

## 2019-01-27 ENCOUNTER — Ambulatory Visit (INDEPENDENT_AMBULATORY_CARE_PROVIDER_SITE_OTHER): Payer: BLUE CROSS/BLUE SHIELD

## 2019-01-27 VITALS — Ht 60.0 in | Wt 164.0 lb

## 2019-01-27 DIAGNOSIS — S82841A Displaced bimalleolar fracture of right lower leg, initial encounter for closed fracture: Secondary | ICD-10-CM

## 2019-01-27 NOTE — Progress Notes (Signed)
   Post-Op Visit Note   Patient: Rita Long           Date of Birth: April 10, 1991           MRN: [id number] Visit Date: 01/27/2019 PCP: Delsa Bern, MD   Assessment & Plan: Dressing applied cam boot.  Return in 1 week for staple removal and Steri-Strip application.  Chief Complaint:  Chief Complaint  Patient presents with  . Right Ankle - Routine Post Op    01/18/2019 ORIF Right Ankle   Visit Diagnoses:  1. Closed bimalleolar fracture of right ankle, initial encounter     Plan: Continue elevation.  She has had some right buttocks and right lateral hip discomfort keeping her leg elevated we discussed have her knee propped up above her heart but having some elbow flexion to decrease some the stress on her buttocks and trochanter region.  Return in 1 week for likely staple removal.  Ankle x-rays show good position alignment.  Follow-Up Instructions: Return in about 1 week (around 02/03/2019).   Orders:  Orders Placed This Encounter  Procedures  . XR Ankle Complete Right   No orders of the defined types were placed in this encounter.   Imaging: No results found.  PMFS History: Patient Active Problem List   Diagnosis Date Noted  . Bimalleolar fracture of right ankle 01/18/2019  . Closed right ankle fracture 01/18/2019  . Fibroid 09/23/2011  . Short stature 09/23/2011   Past Medical History:  Diagnosis Date  . Fibroid    "unsure"    Family History  Problem Relation Age of Onset  . Hypertension Mother   . Anesthesia problems Neg Hx   . Hypotension Neg Hx   . Malignant hyperthermia Neg Hx   . Pseudochol deficiency Neg Hx     Past Surgical History:  Procedure Laterality Date  . CESAREAN SECTION  07/01/2011   Procedure: CESAREAN SECTION;  Surgeon: Alwyn Pea, MD;  Location: [facility];  Service: Gynecology;  Laterality: N/A;  . ORIF ANKLE FRACTURE Right 01/18/2019   Procedure: OPEN REDUCTION INTERNAL FIXATION (ORIF) RIGHT ANKLE( BI-MALEOLAR-POSTERIOR AND  LATERAL);  Surgeon: Marybelle Killings, MD;  Location: [facility];  Service: Orthopedics;  Laterality: Right;  POPLITEAL BLOCK  . WISDOM TOOTH EXTRACTION  2015   Social History   Occupational History  . Not on file  Tobacco Use  . Smoking status: Former Smoker    Packs/day: 0.25    Quit date: 06/22/2010    Years since quitting: 8.6  . Smokeless tobacco: Never Used  . Tobacco comment: not currently smoking or using marijuana  Substance and Sexual Activity  . Alcohol use: No  . Drug use: No    Types: Marijuana, Other-see comments    Comment: pt states no drugs at this visit  . Sexual activity: Not Currently    Birth control/protection: I.U.D.    Comment: Mirena 09-08-11

## 2019-01-30 ENCOUNTER — Telehealth: Payer: Self-pay | Admitting: Orthopaedic Surgery

## 2019-01-30 NOTE — Telephone Encounter (Signed)
Patient called stating that she went back to work today and that her employer needs a note stating that it is okay for her to return to work today.  She would like it emailed to her at: [email or web address].  CB#[phone].  Thank you.

## 2019-01-31 NOTE — Telephone Encounter (Signed)
Ok to do thanks. 

## 2019-01-31 NOTE — Telephone Encounter (Signed)
Ok for return to work note? Patient works from home. I did not see discussed in chart.

## 2019-02-01 NOTE — Telephone Encounter (Signed)
sent 

## 2019-02-03 ENCOUNTER — Ambulatory Visit (INDEPENDENT_AMBULATORY_CARE_PROVIDER_SITE_OTHER): Payer: BLUE CROSS/BLUE SHIELD | Admitting: Orthopaedic Surgery

## 2019-02-03 ENCOUNTER — Encounter: Payer: Self-pay | Admitting: Orthopaedic Surgery

## 2019-02-03 VITALS — Ht 60.0 in | Wt 164.0 lb

## 2019-02-03 DIAGNOSIS — S82841D Displaced bimalleolar fracture of right lower leg, subsequent encounter for closed fracture with routine healing: Secondary | ICD-10-CM

## 2019-02-03 NOTE — Progress Notes (Signed)
   Post-Op Visit Note   Patient: Rita Long           Date of Birth: Sep 06, 1990           MRN: [id number] Visit Date: 02/03/2019 PCP: Delsa Bern, MD   Assessment & Plan: Post-ORIF bimalleolar ankle fracture 01/18/2019.  Staples are removed today.  She is on ibuprofen.  Cam boot applied.  Return 4 weeks repeat x-rays at that time.  She has a shower chair and can remove her cam boot for showering.  She is nonweightbearing.  Chief Complaint:  Chief Complaint  Patient presents with  . Right Ankle - Follow-up    01/18/2019 ORIF Right Bimalleolar Fx   Visit Diagnoses:  1. Closed bimalleolar fracture of right ankle with routine healing, subsequent encounter     Plan: Return 4 weeks repeat x-rays on return 3 views right ankle.  Follow-Up Instructions: Return in about 1 week (around 02/10/2019).   Orders:  No orders of the defined types were placed in this encounter.  No orders of the defined types were placed in this encounter.   Imaging: No results found.  PMFS History: Patient Active Problem List   Diagnosis Date Noted  . Bimalleolar fracture of right ankle 01/18/2019  . Closed right ankle fracture 01/18/2019  . Fibroid 09/23/2011  . Short stature 09/23/2011   Past Medical History:  Diagnosis Date  . Fibroid    "unsure"    Family History  Problem Relation Age of Onset  . Hypertension Mother   . Anesthesia problems Neg Hx   . Hypotension Neg Hx   . Malignant hyperthermia Neg Hx   . Pseudochol deficiency Neg Hx     Past Surgical History:  Procedure Laterality Date  . CESAREAN SECTION  07/01/2011   Procedure: CESAREAN SECTION;  Surgeon: Alwyn Pea, MD;  Location: [facility];  Service: Gynecology;  Laterality: N/A;  . ORIF ANKLE FRACTURE Right 01/18/2019   Procedure: OPEN REDUCTION INTERNAL FIXATION (ORIF) RIGHT ANKLE( BI-MALEOLAR-POSTERIOR AND LATERAL);  Surgeon: Marybelle Killings, MD;  Location: [facility];  Service: Orthopedics;  Laterality: Right;  POPLITEAL BLOCK  .  WISDOM TOOTH EXTRACTION  2015   Social History   Occupational History  . Not on file  Tobacco Use  . Smoking status: Former Smoker    Packs/day: 0.25    Quit date: 06/22/2010    Years since quitting: 8.6  . Smokeless tobacco: Never Used  . Tobacco comment: not currently smoking or using marijuana  Substance and Sexual Activity  . Alcohol use: No  . Drug use: No    Types: Marijuana, Other-see comments    Comment: pt states no drugs at this visit  . Sexual activity: Not Currently    Birth control/protection: I.U.D.    Comment: Mirena 09-08-11

## 2019-02-28 ENCOUNTER — Ambulatory Visit (INDEPENDENT_AMBULATORY_CARE_PROVIDER_SITE_OTHER): Payer: BLUE CROSS/BLUE SHIELD | Admitting: Orthopaedic Surgery

## 2019-02-28 ENCOUNTER — Encounter: Payer: Self-pay | Admitting: Orthopaedic Surgery

## 2019-02-28 ENCOUNTER — Ambulatory Visit (INDEPENDENT_AMBULATORY_CARE_PROVIDER_SITE_OTHER): Payer: BLUE CROSS/BLUE SHIELD

## 2019-02-28 VITALS — BP 128/76 | HR 71 | Ht 60.0 in | Wt 164.0 lb

## 2019-02-28 DIAGNOSIS — S82841D Displaced bimalleolar fracture of right lower leg, subsequent encounter for closed fracture with routine healing: Secondary | ICD-10-CM

## 2019-02-28 NOTE — Progress Notes (Signed)
Post-Op Visit Note   Patient: Rita Long           Date of Birth: 08-Nov-1990           MRN: [id number] Visit Date: 02/28/2019 PCP: Delsa Bern, MD   Assessment & Plan: Follow-up ORIF posterior malleolus lateral malleolus.  Outpatient therapy needed.  She needs to stretch her heel cord.  Chief Complaint:  Chief Complaint  Patient presents with  . Right Ankle - Follow-up    01/18/2019 ORIF Right Bimalleolar Fx   Visit Diagnoses:  1. Closed bimalleolar fracture of right ankle with routine healing, subsequent encounter     Plan: X-rays show satisfactory healing.  She needs to get her ankle moving and I recommend formal physical therapy.  She has been staying with her parents and asked [place] and can she drive.  When she is been wearing her boot her heels not been all the way down in the boot.  We discussed standing on a step letting her heel drop to help stretch her heel cord.  Formal physical therapy ordered.  Recheck 7 weeks.  She can progressively weight-bear and work on ankle range of motion.  Follow-Up Instructions: Return in about 7 weeks (around 04/18/2019).   Orders:  Orders Placed This Encounter  Procedures  . XR Ankle Complete Right  . Ambulatory referral to Physical Therapy   No orders of the defined types were placed in this encounter.   Imaging: Xr Ankle Complete Right  Result Date: 02/28/2019 AP lateral mortise x-ray right ankle obtained and reviewed this shows fixation posterior malleolus with lag screw from front to back with interval healing the posterior malleolus.  Fibular maintains satisfactory alignment with still some persistent lucency at the fracture line. Impression: Healing right ankle bimalleolar ankle fracture.  Mortise is well reduced.   PMFS History: Patient Active Problem List   Diagnosis Date Noted  . Bimalleolar fracture of right ankle 01/18/2019  . Closed right ankle fracture 01/18/2019  . Fibroid 09/23/2011  . Short stature 09/23/2011    Past Medical History:  Diagnosis Date  . Fibroid    "unsure"    Family History  Problem Relation Age of Onset  . Hypertension Mother   . Anesthesia problems Neg Hx   . Hypotension Neg Hx   . Malignant hyperthermia Neg Hx   . Pseudochol deficiency Neg Hx     Past Surgical History:  Procedure Laterality Date  . CESAREAN SECTION  07/01/2011   Procedure: CESAREAN SECTION;  Surgeon: Alwyn Pea, MD;  Location: [facility];  Service: Gynecology;  Laterality: N/A;  . ORIF ANKLE FRACTURE Right 01/18/2019   Procedure: OPEN REDUCTION INTERNAL FIXATION (ORIF) RIGHT ANKLE( BI-MALEOLAR-POSTERIOR AND LATERAL);  Surgeon: Marybelle Killings, MD;  Location: [facility];  Service: Orthopedics;  Laterality: Right;  POPLITEAL BLOCK  . WISDOM TOOTH EXTRACTION  2015   Social History   Occupational History  . Not on file  Tobacco Use  . Smoking status: Former Smoker    Packs/day: 0.25    Quit date: 06/22/2010    Years since quitting: 8.6  . Smokeless tobacco: Never Used  . Tobacco comment: not currently smoking or using marijuana  Substance and Sexual Activity  . Alcohol use: No  . Drug use: No    Types: Marijuana, Other-see comments    Comment: pt states no drugs at this visit  . Sexual activity: Not Currently    Birth control/protection: I.U.D.    Comment: Mirena 09-08-11

## 2019-03-01 ENCOUNTER — Other Ambulatory Visit: Payer: Self-pay

## 2019-03-01 ENCOUNTER — Ambulatory Visit: Payer: BLUE CROSS/BLUE SHIELD | Attending: Orthopaedic Surgery

## 2019-03-01 DIAGNOSIS — M25671 Stiffness of right ankle, not elsewhere classified: Secondary | ICD-10-CM | POA: Insufficient documentation

## 2019-03-01 DIAGNOSIS — R2689 Other abnormalities of gait and mobility: Secondary | ICD-10-CM | POA: Insufficient documentation

## 2019-03-01 DIAGNOSIS — M25571 Pain in right ankle and joints of right foot: Secondary | ICD-10-CM | POA: Diagnosis present

## 2019-03-01 DIAGNOSIS — R600 Localized edema: Secondary | ICD-10-CM | POA: Diagnosis present

## 2019-03-01 DIAGNOSIS — M6281 Muscle weakness (generalized): Secondary | ICD-10-CM | POA: Diagnosis present

## 2019-03-01 NOTE — Patient Instructions (Signed)
Access Code: [email or web address]  URL: [email or web address]  Date: 03/01/2019  Prepared by: Sigurd Sos   Exercises Long Sitting Calf Stretch with Strap - 10 reps - 10 hold - 2x daily - 7x weekly Supine Ankle Inversion Eversion AROM - 20 reps - 4x daily - 7x weekly Supine Ankle Circles - 20 reps - 4x daily - 7x weekly Long Sitting Ankle Pumps - 20 reps - 1x daily - 7x weekly

## 2019-03-01 NOTE — Therapy (Signed)
[facility] [address], [place], [place], [address] Phone: [phone]   Fax:  [phone]  Physical Therapy Evaluation  Patient Details  Name: Rita Long MRN: [id number] Date of Birth: [birth year] Referring Provider (PT): Rodell Perna, MD   Encounter Date: 03/01/2019  PT End of Session - 03/01/19 1146    Visit Number  1    Date for PT Re-Evaluation  05/24/19    Authorization Type  BCBS    PT Start Time  1105    PT Stop Time  1152    PT Time Calculation (min)  47 min    Activity Tolerance  Patient tolerated treatment well    Behavior During Therapy  [facility] for tasks assessed/performed       Past Medical History:  Diagnosis Date  . Fibroid    "unsure"    Past Surgical History:  Procedure Laterality Date  . CESAREAN SECTION  07/01/2011   Procedure: CESAREAN SECTION;  Surgeon: Alwyn Pea, MD;  Location: [facility];  Service: Gynecology;  Laterality: N/A;  . ORIF ANKLE FRACTURE Right 01/18/2019   Procedure: OPEN REDUCTION INTERNAL FIXATION (ORIF) RIGHT ANKLE( BI-MALEOLAR-POSTERIOR AND LATERAL);  Surgeon: Marybelle Killings, MD;  Location: [facility];  Service: Orthopedics;  Laterality: Right;  POPLITEAL BLOCK  . WISDOM TOOTH EXTRACTION  2015    There were no vitals filed for this visit.   Subjective Assessment - 03/01/19 1112    Subjective  Pt presents to PT s/p Rt ankle ORIF due to bimalleolar fracture.  Pt had a fall in the night and surgery.  Pt has been non weight bearing until yesterday now pt is WBAT and MD would like pt to be able to put foot on the ground before beginning to fully weight bear.    Pertinent History  none    Limitations  Standing;Walking    How long can you sit comfortably?  just started WB so not sure    How long can you stand comfortably?  just started WB so not sure    Diagnostic tests  x-ray: healing well    Patient Stated Goals  return to walking without device, return home to apartment,  drive    Currently in Pain?  Yes    Pain Score  0-No pain   2/10 with standing   Pain Location  Ankle    Pain Orientation  Right    Pain Descriptors / Indicators  Sore;Tightness    Pain Type  Surgical pain    Pain Onset  More than a month ago    Pain Frequency  Intermittent    Aggravating Factors   after taking a shower, end of the day    Pain Relieving Factors  ice, ibuprofen         [facility] PT Assessment - 03/01/19 0001      Assessment   Medical Diagnosis  closed bimalleolar fracture of Rt ankle with routine healing    Referring Provider (PT)  Rodell Perna, MD    Onset Date/Surgical Date  01/18/19    Next MD Visit  6 weeks     Prior Therapy  none      Precautions   Precautions  None      Restrictions   Weight Bearing Restrictions  Yes    RLE Weight Bearing  Partial weight bearing   start at up to 75% and progress as pt able to put foot flat     Balance Screen  Has the patient fallen in the past 6 months  Yes    How many times?  1   middle of the night   Has the patient had a decrease in activity level because of a fear of falling?   Yes    Is the patient reluctant to leave their home because of a fear of falling?   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    Leisure  walking for exercise      Cognition   Overall Cognitive Status  Within Functional Limits for tasks assessed      Observation/Other Assessments   Focus on Therapeutic Outcomes (FOTO)   71% limitation      Posture/Postural Control   Posture/Postural Control  No significant limitations      ROM / Strength   AROM / PROM / Strength  AROM;PROM;Strength      AROM   Overall  AROM   Deficits    Overall AROM Comments  very little active range of Rt ankle (<10 degrees in each direction)      PROM   Overall PROM   Deficits    PROM Assessment Site  Ankle    Right/Left Ankle  Right    Right Ankle Dorsiflexion  -25    Right Ankle Plantar Flexion  30   resting in this position   Right Ankle Inversion  25    Right Ankle Eversion  5      Strength   Overall Strength  Deficits    Overall Strength Comments  Rt ankle 2- to 2/5 throughout      Palpation   SI assessment   Figure 8 edema: Rt ankle 21.5 inches, 19.5 inches Lt ankle. pitting edema.     Palpation comment  4" incision over the fibula-scabbing present, incision over ventral ankle- healing      Transfers   Transfers  Sit to Stand;Stand to Sit    Sit to Stand  6: Modified independent (Device/Increase time);With upper extremity assist    Stand to Sit  6: Modified independent (Device/Increase time);With upper extremity assist      Ambulation/Gait   Ambulation/Gait  Yes    Assistive device  Crutches    Gait Pattern  Step-to pattern    Gait Comments  toe touch weight bearing in CAM boot on Rt                Objective measurements completed on examination: See above findings.      [facility] Adult PT Treatment/Exercise - 03/01/19 0001      Modalities   Modalities  Vasopneumatic      Vasopneumatic   Number Minutes Vasopneumatic   15 minutes    Vasopnuematic Location   Ankle    Vasopneumatic Pressure  Medium    Vasopneumatic Temperature   3 snowflakes             PT Education - 03/01/19 1133    Education Details  Access Code: [email or web address]    Person(s) Educated  Patient    Methods  Explanation;Demonstration;Handout    Comprehension  Verbalized understanding;Returned demonstration       PT Short Term Goals - 03/01/19 1116      PT SHORT TERM GOAL #1   Title  be independent in initial HEP    Time  4    Period  Weeks    Status  New    Target Date  03/29/19      PT SHORT TERM GOAL #2    Title  demonstrate Rt ankle A/ROM DF to lacking 5 degrees to allow for weightbearing    Time  4    Period  Weeks    Status  New    Target Date  03/29/19      PT SHORT TERM GOAL #3   Title  demonstrate Rt ankle DF P/ROM to 0 degrees to improve mobility and allow for normalized gait    Time  4    Period  Weeks    Status  New    Target Date  03/29/19      PT SHORT TERM GOAL #4   Title  ambulate on level surface with 1 crutch an full weightbearing on Rt LE    Time  4    Period  Weeks    Status  New    Target Date  03/29/19        PT Long Term Goals - 03/01/19 1211      PT LONG TERM GOAL #1   Title  be independent in advanced HEP    Time  12    Period  Weeks    Status  New    Target Date  04/26/19      PT LONG TERM GOAL #2   Title  reduce FOTO to < or 34% limitation    Time  12    Period  Weeks    Status  New    Target Date  05/24/19      PT LONG TERM GOAL #3   Title  demonstrate 4 to 4+/5 Rt ankle strength to improve stability with gait on unlevel surfaces    Time  12    Period  Weeks    Status  New    Target Date  05/24/19      PT LONG TERM GOAL #4   Title  demonstrate Rt ankle A/ROM DF to 8 degrees to normalize gait    Time  12    Period  Weeks    Status  New    Target Date  05/24/19      PT LONG TERM GOAL #5   Title  ambulate on level surfaces without boot and demonstrate symmetry    Time  12    Period  Weeks    Status  New    Target Date  05/24/19      PT LONG TERM GOAL #6   Title  improve strength and endurance to stand and walk for 1 hour to allow for return to independence at home and in the community    Time  12    Period  Weeks    Status  New    Target Date  05/24/19             Plan - 03/01/19 1202    Clinical Impression Statement  Pt presents to PT s/p Rt bimalleolar fracture with ORIF performed 01/18/19. Pt has been non weightbearing in a CAM boot since surgery.  Pt has now  been cleared for WBAT and MD would like her to achieve DF  to neutral to have foot flat with standing.  Pt's heel has not been in down in the boot.  Pt with pitting edema at Rt ankle and dorsum of foot, toe touch weight bearing with bil crutches.  Pt demonstrates <10 degrees of Rt ankle A/ROM in all directions.  P/ROM is limited and strength is 2-/5 to 2/5 throughout.  Pt will benefit from skilled PT to improve Rt ankle strength, A/ROM, gait and edema management s/p ORIF.    Examination-Activity Limitations  Locomotion Level;Transfers;Stand;Stairs;Squat    Examination-Participation Restrictions  Meal Prep    Stability/Clinical Decision Making  Stable/Uncomplicated    Clinical Decision Making  Low    Rehab Potential  Excellent    PT Frequency  2x / week    PT Duration  12 weeks    PT Treatment/Interventions  ADLs/Self Care Home Management;Cryotherapy;Electrical Stimulation;Gait training;Moist Heat;Functional mobility training;Therapeutic activities;Therapeutic exercise;Neuromuscular re-education;Patient/family education;Manual techniques;Scar mobilization;Passive range of motion;Taping;Vasopneumatic Device    PT Next Visit Plan  Rt ankle ROM, gastroc stretch, weight shifting in boot with emphasis on foot flat, scar mobs, P/ROM, Game Ready    PT Home Exercise Plan  Access Code: [email or web address]    Consulted and Agree with Plan of Care  Patient       Patient will benefit from skilled therapeutic intervention in order to improve the following deficits and impairments:  Abnormal gait, Decreased activity tolerance, Decreased mobility, Decreased endurance, Difficulty walking, Decreased scar mobility, Decreased strength, Increased edema, Increased muscle spasms, Impaired flexibility, Pain  Visit Diagnosis: Stiffness of right ankle, not elsewhere classified - Plan: PT plan of care cert/re-cert  Other abnormalities of gait and mobility - Plan: PT plan of care cert/re-cert  Muscle weakness (generalized) - Plan: PT plan of care cert/re-cert  Localized edema - Plan: PT  plan of care cert/re-cert  Pain in right ankle and joints of right foot - Plan: PT plan of care cert/re-cert     Problem List Patient Active Problem List   Diagnosis Date Noted  . Bimalleolar fracture of right ankle 01/18/2019  . Closed right ankle fracture 01/18/2019  . Fibroid 09/23/2011  . Short stature 09/23/2011     Sigurd Sos, PT 03/01/19 12:37 PM  [facility] [address], [place], [place], [address] Phone: [phone]   Fax:  [phone]  Name: Rita Long MRN: [id number] Date of Birth: 11-28-1990

## 2019-03-06 ENCOUNTER — Other Ambulatory Visit: Payer: Self-pay

## 2019-03-06 ENCOUNTER — Ambulatory Visit: Payer: BLUE CROSS/BLUE SHIELD | Admitting: Physical Therapy

## 2019-03-06 ENCOUNTER — Encounter: Payer: Self-pay | Admitting: Physical Therapy

## 2019-03-06 DIAGNOSIS — M25671 Stiffness of right ankle, not elsewhere classified: Secondary | ICD-10-CM | POA: Diagnosis not present

## 2019-03-06 DIAGNOSIS — R600 Localized edema: Secondary | ICD-10-CM

## 2019-03-06 DIAGNOSIS — M25571 Pain in right ankle and joints of right foot: Secondary | ICD-10-CM

## 2019-03-06 DIAGNOSIS — R2689 Other abnormalities of gait and mobility: Secondary | ICD-10-CM

## 2019-03-06 DIAGNOSIS — M6281 Muscle weakness (generalized): Secondary | ICD-10-CM

## 2019-03-06 NOTE — Therapy (Signed)
[facility] [address] [place], [place], [address] Phone: [phone]   Fax:  [phone]  Physical Therapy Treatment  Patient Details  Name: Rita Long MRN: [id number] Date of Birth: 08-Sep-1990 Referring Provider (PT): Rodell Perna, MD   Encounter Date: 03/06/2019  PT End of Session - 03/06/19 1356    Visit Number  2    Date for PT Re-Evaluation  05/24/19    Authorization Type  BCBS    PT Start Time  1356    PT Stop Time  1451    PT Time Calculation (min)  55 min    Activity Tolerance  Patient tolerated treatment well    Behavior During Therapy  [facility] for tasks assessed/performed       Past Medical History:  Diagnosis Date  . Fibroid    "unsure"    Past Surgical History:  Procedure Laterality Date  . CESAREAN SECTION  07/01/2011   Procedure: CESAREAN SECTION;  Surgeon: Alwyn Pea, MD;  Location: [facility];  Service: Gynecology;  Laterality: N/A;  . ORIF ANKLE FRACTURE Right 01/18/2019   Procedure: OPEN REDUCTION INTERNAL FIXATION (ORIF) RIGHT ANKLE( BI-MALEOLAR-POSTERIOR AND LATERAL);  Surgeon: Marybelle Killings, MD;  Location: [facility];  Service: Orthopedics;  Laterality: Right;  POPLITEAL BLOCK  . WISDOM TOOTH EXTRACTION  2015    There were no vitals filed for this visit.  Subjective Assessment - 03/06/19 1357    Subjective  I am doing so much better getting my foot flat on the floor.    Currently in Pain?  No/denies         [facility] PT Assessment - 03/06/19 0001      PROM   Right Ankle Dorsiflexion  -8                   [facility] Adult PT Treatment/Exercise - 03/06/19 0001      Vasopneumatic   Number Minutes Vasopneumatic   15 minutes    Vasopnuematic Location   Ankle    Vasopneumatic Pressure  Medium    Vasopneumatic Temperature   3 snowflakes      Manual Therapy   Manual Therapy  Soft tissue mobilization;Passive ROM    Soft tissue mobilization  Retrograde massage and scar massage  lateral scar.    Passive ROM  RT ankle all motions but mostly DF      Ankle Exercises: Seated   Towel Crunch  --   10x   Towel Inversion/Eversion  5 reps    Towel Inversion/Eversion Limitations  VC to keep heel down     BAPS  Sitting;Level 2    BAPS Limitations  20x each direction, VC to keep heel down                PT Short Term Goals - 03/06/19 1418      PT SHORT TERM GOAL #1   Title  be independent in initial HEP    Status  Achieved        PT Long Term Goals - 03/01/19 1211      PT LONG TERM GOAL #1   Title  be independent in advanced HEP    Time  12    Period  Weeks    Status  New    Target Date  04/26/19      PT LONG TERM GOAL #2   Title  reduce FOTO to < or 34% limitation    Time  12  Period  Weeks    Status  New    Target Date  05/24/19      PT LONG TERM GOAL #3   Title  demonstrate 4 to 4+/5 Rt ankle strength to improve stability with gait on unlevel surfaces    Time  12    Period  Weeks    Status  New    Target Date  05/24/19      PT LONG TERM GOAL #4   Title  demonstrate Rt ankle A/ROM DF to 8 degrees to normalize gait    Time  12    Period  Weeks    Status  New    Target Date  05/24/19      PT LONG TERM GOAL #5   Title  ambulate on level surfaces without boot and demonstrate symmetry    Time  12    Period  Weeks    Status  New    Target Date  05/24/19      PT LONG TERM GOAL #6   Title  improve strength and endurance to stand and walk for 1 hour to allow for return to independence at home and in the community    Time  12    Period  Weeks    Status  New    Target Date  05/24/19            Plan - 03/06/19 1356    Clinical Impression Statement  Pt arrives to therapy with no pain complaints and WBAT in her CAM boot. She is doing significantly better getting her foot flat. Pt's active dorsiflexion today measures -8 degrees, on eval she was -25 degrees so significant improvement there. Pt has difficulty with AROM in all planes  of her ankle when trying to move a towel on the floor. Edema remains moderate but improves with the Game Ready. Advised pt to keep the leg elevated.    Examination-Activity Limitations  Locomotion Level;Transfers;Stand;Stairs;Squat    Examination-Participation Restrictions  Meal Prep    Stability/Clinical Decision Making  Stable/Uncomplicated    Rehab Potential  Excellent    PT Frequency  2x / week    PT Duration  12 weeks    PT Treatment/Interventions  ADLs/Self Care Home Management;Cryotherapy;Electrical Stimulation;Gait training;Moist Heat;Functional mobility training;Therapeutic activities;Therapeutic exercise;Neuromuscular re-education;Patient/family education;Manual techniques;Scar mobilization;Passive range of motion;Taping;Vasopneumatic Device    PT Next Visit Plan  Rt ankle ROM, gastroc stretch, weight shifting in boot with emphasis on foot flat, scar mobs, P/ROM, Game Ready    PT Home Exercise Plan  Access Code: [email or web address]    Consulted and Agree with Plan of Care  Patient       Patient will benefit from skilled therapeutic intervention in order to improve the following deficits and impairments:  Abnormal gait, Decreased activity tolerance, Decreased mobility, Decreased endurance, Difficulty walking, Decreased scar mobility, Decreased strength, Increased edema, Increased muscle spasms, Impaired flexibility, Pain  Visit Diagnosis: Stiffness of right ankle, not elsewhere classified  Other abnormalities of gait and mobility  Muscle weakness (generalized)  Localized edema  Pain in right ankle and joints of right foot     Problem List Patient Active Problem List   Diagnosis Date Noted  . Bimalleolar fracture of right ankle 01/18/2019  . Closed right ankle fracture 01/18/2019  . Fibroid 09/23/2011  . Short stature 09/23/2011    Scotlynn Noyes, PTA 03/06/2019, 2:41 PM  [facility] [address], [place],  [place], [address] Phone: [phone]  Fax:  [phone]  Name: Ilise Alstrom MRN: [id number] Date of Birth: [birth year]

## 2019-03-10 ENCOUNTER — Other Ambulatory Visit: Payer: Self-pay

## 2019-03-10 ENCOUNTER — Encounter: Payer: Self-pay | Admitting: Physical Therapy

## 2019-03-10 ENCOUNTER — Ambulatory Visit: Payer: BLUE CROSS/BLUE SHIELD | Admitting: Physical Therapy

## 2019-03-10 DIAGNOSIS — R600 Localized edema: Secondary | ICD-10-CM

## 2019-03-10 DIAGNOSIS — M25671 Stiffness of right ankle, not elsewhere classified: Secondary | ICD-10-CM

## 2019-03-10 DIAGNOSIS — M6281 Muscle weakness (generalized): Secondary | ICD-10-CM

## 2019-03-10 DIAGNOSIS — M25571 Pain in right ankle and joints of right foot: Secondary | ICD-10-CM

## 2019-03-10 DIAGNOSIS — R2689 Other abnormalities of gait and mobility: Secondary | ICD-10-CM

## 2019-03-10 NOTE — Therapy (Signed)
[facility] [address], [place], [place], [address] Phone: [phone]   Fax:  [phone]  Physical Therapy Treatment  Patient Details  Name: Rita Long MRN: [id number] Date of Birth: Mar 19, 1991 Referring Provider (PT): Rodell Perna, MD   Encounter Date: 03/10/2019  PT End of Session - 03/10/19 1035    Visit Number  3    Date for PT Re-Evaluation  05/24/19    Authorization Type  BCBS    PT Start Time  0947    PT Stop Time  1040    PT Time Calculation (min)  53 min    Equipment Utilized During Treatment  Other (comment)   Rt CAM boot   Activity Tolerance  Patient tolerated treatment well;No increased pain    Behavior During Therapy  [facility] for tasks assessed/performed       Past Medical History:  Diagnosis Date  . Fibroid    "unsure"    Past Surgical History:  Procedure Laterality Date  . CESAREAN SECTION  07/01/2011   Procedure: CESAREAN SECTION;  Surgeon: Alwyn Pea, MD;  Location: [facility];  Service: Gynecology;  Laterality: N/A;  . ORIF ANKLE FRACTURE Right 01/18/2019   Procedure: OPEN REDUCTION INTERNAL FIXATION (ORIF) RIGHT ANKLE( BI-MALEOLAR-POSTERIOR AND LATERAL);  Surgeon: Marybelle Killings, MD;  Location: [facility];  Service: Orthopedics;  Laterality: Right;  POPLITEAL BLOCK  . WISDOM TOOTH EXTRACTION  2015    There were no vitals filed for this visit.  Subjective Assessment - 03/10/19 0948    Subjective  Pt states that things are going well. No pain currently.    Currently in Pain?  No/denies         [facility] PT Assessment - 03/10/19 0001      PROM   Overall PROM Comments  Rt great toe extension 40 deg     Right Ankle Dorsiflexion  -5    Right Ankle Plantar Flexion  40    Right Ankle Inversion  30                   [facility] Adult PT Treatment/Exercise - 03/10/19 0001      Vasopneumatic   Number Minutes Vasopneumatic   15 minutes    Vasopnuematic Location   Ankle    Vasopneumatic Pressure  Medium    Vasopneumatic Temperature   3 snowflakes      Manual Therapy   Manual Therapy  Joint mobilization    Joint Mobilization  Rt grade III AP talocrural mobilization x2 bouts; Rt metatarsal AP/PA mobilizations x2 reps; Rt great toe extension mobilization x3 bouts     Soft tissue mobilization  scar mobilization    Passive ROM  Rt ankle DF, PF, inversion, Rt great toe extension/flexion      Ankle Exercises: Standing   Other Standing Ankle Exercises  standing weight bearing BLE with crutches focusing on equal weight distribution              PT Education - 03/10/19 1031    Education Details  updates to [employer]) Educated  Patient    Methods  Explanation;Handout    Comprehension  Verbalized understanding;Returned demonstration       PT Short Term Goals - 03/06/19 1418      PT SHORT TERM GOAL #1   Title  be independent in initial HEP    Status  Achieved        PT Long Term Goals - 03/01/19 1211  PT LONG TERM GOAL #1   Title  be independent in advanced HEP    Time  12    Period  Weeks    Status  New    Target Date  04/26/19      PT LONG TERM GOAL #2   Title  reduce FOTO to < or 34% limitation    Time  12    Period  Weeks    Status  New    Target Date  05/24/19      PT LONG TERM GOAL #3   Title  demonstrate 4 to 4+/5 Rt ankle strength to improve stability with gait on unlevel surfaces    Time  12    Period  Weeks    Status  New    Target Date  05/24/19      PT LONG TERM GOAL #4   Title  demonstrate Rt ankle A/ROM DF to 8 degrees to normalize gait    Time  12    Period  Weeks    Status  New    Target Date  05/24/19      PT LONG TERM GOAL #5   Title  ambulate on level surfaces without boot and demonstrate symmetry    Time  12    Period  Weeks    Status  New    Target Date  05/24/19      PT LONG TERM GOAL #6   Title  improve strength and endurance to stand and walk for 1 hour to allow for return to independence  at home and in the community    Time  12    Period  Weeks    Status  New    Target Date  05/24/19            Plan - 03/10/19 1036    Clinical Impression Statement  Pt continues to make steady progress towards her goals. Today focused heavily on manual treatment to increase foot and ankle ROM. Pt has noted limitations in Rt great toe flexion/extension and was instructed on ways to stretch this at home as well. End of session, pt lacks 5 deg from ankle dorsiflexion to neutral and ankle plantarflexion improved to 40 deg. No pain was noted end of session and vasopneumatic device was applied to decrease swelling.    Examination-Activity Limitations  Locomotion Level;Transfers;Stand;Stairs;Squat    Examination-Participation Restrictions  Meal Prep    Stability/Clinical Decision Making  Stable/Uncomplicated    Rehab Potential  Excellent    PT Frequency  2x / week    PT Duration  12 weeks    PT Treatment/Interventions  ADLs/Self Care Home Management;Cryotherapy;Electrical Stimulation;Gait training;Moist Heat;Functional mobility training;Therapeutic activities;Therapeutic exercise;Neuromuscular re-education;Patient/family education;Manual techniques;Scar mobilization;Passive range of motion;Taping;Vasopneumatic Device    PT Next Visit Plan  Rt ankle ROM, gastroc stretch, weight shifting in boot with emphasis on foot flat, scar mobs, P/ROM, Game Ready    PT Home Exercise Plan  Access Code: [email or web address]    Consulted and Agree with Plan of Care  Patient       Patient will benefit from skilled therapeutic intervention in order to improve the following deficits and impairments:  Abnormal gait, Decreased activity tolerance, Decreased mobility, Decreased endurance, Difficulty walking, Decreased scar mobility, Decreased strength, Increased edema, Increased muscle spasms, Impaired flexibility, Pain  Visit Diagnosis: Stiffness of right ankle, not elsewhere classified  Other abnormalities of gait and  mobility  Muscle weakness (generalized)  Localized edema  Pain in right ankle and joints  of right foot     Problem List Patient Active Problem List   Diagnosis Date Noted  . Bimalleolar fracture of right ankle 01/18/2019  . Closed right ankle fracture 01/18/2019  . Fibroid 09/23/2011  . Short stature 09/23/2011    11:13 AM,03/10/19 Sherol Dade PT, DPT [place] [facility] [address] [place], [place], [address] Phone: [phone]   Fax:  [phone]  Name: Jailene Wagner MRN: [id number] Date of Birth: [birth year]

## 2019-03-10 NOTE — Patient Instructions (Signed)
Access Code: [email or web address]  URL: [email or web address]  Date: 03/10/2019  Prepared by: Sherol Dade   Exercises  Long Sitting Calf Stretch with Strap - 10 reps - 10 hold - 2x daily - 7x weekly  Supine Ankle Inversion Eversion AROM - 20 reps - 4x daily - 7x weekly  Supine Ankle Circles - 20 reps - 4x daily - 7x weekly  Long Sitting AnAccess Code: [email or web address]  URL: [email or web address]  Date: 03/10/2019   Prepared by: [facility] [address] [place], [place] [address] Phone # [phone] Fax [phone]

## 2019-03-13 ENCOUNTER — Encounter: Payer: Self-pay | Admitting: Physical Therapy

## 2019-03-13 ENCOUNTER — Ambulatory Visit: Payer: BLUE CROSS/BLUE SHIELD | Admitting: Physical Therapy

## 2019-03-13 ENCOUNTER — Other Ambulatory Visit: Payer: Self-pay

## 2019-03-13 ENCOUNTER — Telehealth: Payer: Self-pay | Admitting: Orthopaedic Surgery

## 2019-03-13 DIAGNOSIS — M6281 Muscle weakness (generalized): Secondary | ICD-10-CM

## 2019-03-13 DIAGNOSIS — R600 Localized edema: Secondary | ICD-10-CM

## 2019-03-13 DIAGNOSIS — R2689 Other abnormalities of gait and mobility: Secondary | ICD-10-CM

## 2019-03-13 DIAGNOSIS — M25671 Stiffness of right ankle, not elsewhere classified: Secondary | ICD-10-CM | POA: Diagnosis not present

## 2019-03-13 DIAGNOSIS — M25571 Pain in right ankle and joints of right foot: Secondary | ICD-10-CM

## 2019-03-13 NOTE — Telephone Encounter (Signed)
Holding for Betsy 

## 2019-03-13 NOTE — Telephone Encounter (Signed)
The dates need to be 01/18/19 to 06/14/2020.  Thank you.

## 2019-03-13 NOTE — Therapy (Signed)
[facility] [address], [place], [place], [address] Phone: [phone]   Fax:  [phone]  Physical Therapy Treatment  Patient Details  Name: Rita Long MRN: [id number] Date of Birth: [birth year] Referring Provider (PT): Rodell Perna, MD   Encounter Date: 03/13/2019  PT End of Session - 03/13/19 1404    Visit Number  4    Date for PT Re-Evaluation  05/24/19    Authorization Type  BCBS    PT Start Time  1400    PT Stop Time  1455    PT Time Calculation (min)  55 min    Activity Tolerance  Patient tolerated treatment well;No increased pain    Behavior During Therapy  WFL for tasks assessed/performed       Past Medical History:  Diagnosis Date  . Fibroid    "unsure"    Past Surgical History:  Procedure Laterality Date  . CESAREAN SECTION  07/01/2011   Procedure: CESAREAN SECTION;  Surgeon: Alwyn Pea, MD;  Location: [facility];  Service: Gynecology;  Laterality: N/A;  . ORIF ANKLE FRACTURE Right 01/18/2019   Procedure: OPEN REDUCTION INTERNAL FIXATION (ORIF) RIGHT ANKLE( BI-MALEOLAR-POSTERIOR AND LATERAL);  Surgeon: Marybelle Killings, MD;  Location: [facility];  Service: Orthopedics;  Laterality: Right;  POPLITEAL BLOCK  . WISDOM TOOTH EXTRACTION  2015    There were no vitals filed for this visit.  Subjective Assessment - 03/13/19 1405    Subjective  No pain, my swelling is better.    Currently in Pain?  No/denies    Multiple Pain Sites  No                       [facility] Adult PT Treatment/Exercise - 03/13/19 0001      Vasopneumatic   Number Minutes Vasopneumatic   15 minutes    Vasopnuematic Location   Ankle    Vasopneumatic Pressure  Medium    Vasopneumatic Temperature   3 snowflakes      Manual Therapy   Soft tissue mobilization  scar mobilization    Passive ROM  Rt ankle DF, PF, inversion, Rt great toe extension/flexion      Ankle Exercises: Seated   Towel Crunch  --   Attempted  but could not do   [employer]  Attempted but could not pick up    BAPS  Sitting;Level 3    BAPS Limitations  20x each direction, VC to keep heel down    PTA min assist to control board     Ankle Exercises: Supine   T-Band  Yellow DF/PF 20x each             PT Education - 03/13/19 1443    Education Details  Yellow band ankle DF/PF for HEP    Person(s) Educated  Patient    Methods  Explanation;Demonstration;Verbal cues;Handout    Comprehension  Returned demonstration;Verbalized understanding       PT Short Term Goals - 03/06/19 1418      PT SHORT TERM GOAL #1   Title  be independent in initial HEP    Status  Achieved        PT Long Term Goals - 03/01/19 1211      PT LONG TERM GOAL #1   Title  be independent in advanced HEP    Time  12    Period  Weeks    Status  New    Target Date  04/26/19  PT LONG TERM GOAL #2   Title  reduce FOTO to < or 34% limitation    Time  12    Period  Weeks    Status  New    Target Date  05/24/19      PT LONG TERM GOAL #3   Title  demonstrate 4 to 4+/5 Rt ankle strength to improve stability with gait on unlevel surfaces    Time  12    Period  Weeks    Status  New    Target Date  05/24/19      PT LONG TERM GOAL #4   Title  demonstrate Rt ankle A/ROM DF to 8 degrees to normalize gait    Time  12    Period  Weeks    Status  New    Target Date  05/24/19      PT LONG TERM GOAL #5   Title  ambulate on level surfaces without boot and demonstrate symmetry    Time  12    Period  Weeks    Status  New    Target Date  05/24/19      PT LONG TERM GOAL #6   Title  improve strength and endurance to stand and walk for 1 hour to allow for return to independence at home and in the community    Time  12    Period  Weeks    Status  New    Target Date  05/24/19            Plan - 03/13/19 1404    Clinical Impression Statement  Pt arrives with no pain. She is compliant with her HEP/stretches. Swelling reducing and ROM  improving. Pt has difficulty making circles with BAPS board. Active DF was -10 but passively we could reach neutral.    Examination-Activity Limitations  Locomotion Level;Transfers;Stand;Stairs;Squat    Examination-Participation Restrictions  Meal Prep    Stability/Clinical Decision Making  Stable/Uncomplicated    Rehab Potential  Excellent    PT Frequency  2x / week    PT Duration  12 weeks    PT Treatment/Interventions  ADLs/Self Care Home Management;Cryotherapy;Electrical Stimulation;Gait training;Moist Heat;Functional mobility training;Therapeutic activities;Therapeutic exercise;Neuromuscular re-education;Patient/family education;Manual techniques;Scar mobilization;Passive range of motion;Taping;Vasopneumatic Device    PT Next Visit Plan  Rt ankle ROM, gastroc stretch, weight shifting in boot with emphasis on foot flat, scar mobs, P/ROM, Game Ready    PT Home Exercise Plan  Access Code: [email or web address]    Consulted and Agree with Plan of Care  Patient       Patient will benefit from skilled therapeutic intervention in order to improve the following deficits and impairments:  Abnormal gait, Decreased activity tolerance, Decreased mobility, Decreased endurance, Difficulty walking, Decreased scar mobility, Decreased strength, Increased edema, Increased muscle spasms, Impaired flexibility, Pain  Visit Diagnosis: Stiffness of right ankle, not elsewhere classified  Other abnormalities of gait and mobility  Muscle weakness (generalized)  Localized edema  Pain in right ankle and joints of right foot     Problem List Patient Active Problem List   Diagnosis Date Noted  . Bimalleolar fracture of right ankle 01/18/2019  . Closed right ankle fracture 01/18/2019  . Fibroid 09/23/2011  . Short stature 09/23/2011    Rodolph Hagemann, PTA 03/13/2019, 2:46 PM  [facility] [address] [place], [place], [address] Phone: [phone]    Fax:  [phone]  Name: Rita Long MRN: [id number] Date of Birth: Jan 25, 1991

## 2019-03-13 NOTE — Telephone Encounter (Signed)
Patient called stating that she received her FMLA back from [employer].  She is still going to PT and will need intermittent FMLA for job security and also able to attend her PT visits.  She said that it needs to be updated and faxed back to [facility].  CB#[phone].  Thank you.

## 2019-03-15 ENCOUNTER — Other Ambulatory Visit: Payer: Self-pay

## 2019-03-15 ENCOUNTER — Ambulatory Visit: Payer: BLUE CROSS/BLUE SHIELD | Admitting: Physical Therapy

## 2019-03-15 ENCOUNTER — Encounter: Payer: Self-pay | Admitting: Physical Therapy

## 2019-03-15 DIAGNOSIS — R2689 Other abnormalities of gait and mobility: Secondary | ICD-10-CM

## 2019-03-15 DIAGNOSIS — R600 Localized edema: Secondary | ICD-10-CM

## 2019-03-15 DIAGNOSIS — M6281 Muscle weakness (generalized): Secondary | ICD-10-CM

## 2019-03-15 DIAGNOSIS — M25671 Stiffness of right ankle, not elsewhere classified: Secondary | ICD-10-CM

## 2019-03-15 DIAGNOSIS — M25571 Pain in right ankle and joints of right foot: Secondary | ICD-10-CM

## 2019-03-15 NOTE — Therapy (Signed)
[facility] [address], [place], [place], [address] Phone: [phone]   Fax:  [phone]  Physical Therapy Treatment  Patient Details  Name: Rita Long MRN: [id number] Date of Birth: [birth year] Referring Provider (PT): Rodell Perna, MD   Encounter Date: 03/15/2019  PT End of Session - 03/15/19 1405    Visit Number  5    Date for PT Re-Evaluation  05/24/19    Authorization Type  BCBS    PT Start Time  1402    PT Stop Time  1501    PT Time Calculation (min)  59 min    Activity Tolerance  Patient tolerated treatment well    Behavior During Therapy  [facility] for tasks assessed/performed       Past Medical History:  Diagnosis Date  . Fibroid    "unsure"    Past Surgical History:  Procedure Laterality Date  . CESAREAN SECTION  07/01/2011   Procedure: CESAREAN SECTION;  Surgeon: Alwyn Pea, MD;  Location: [facility];  Service: Gynecology;  Laterality: N/A;  . ORIF ANKLE FRACTURE Right 01/18/2019   Procedure: OPEN REDUCTION INTERNAL FIXATION (ORIF) RIGHT ANKLE( BI-MALEOLAR-POSTERIOR AND LATERAL);  Surgeon: Marybelle Killings, MD;  Location: [facility];  Service: Orthopedics;  Laterality: Right;  POPLITEAL BLOCK  . WISDOM TOOTH EXTRACTION  2015    There were no vitals filed for this visit.  Subjective Assessment - 03/15/19 1407    Subjective  Nothing major to report. Had some sorness after doing her stretches last night. It went away in 5 minutes.    Currently in Pain?  Yes    Pain Score  1     Pain Location  Ankle    Pain Orientation  Right    Pain Descriptors / Indicators  Sore    Aggravating Factors   no tmuch pain, maybe if I stretch too hard    Pain Relieving Factors  ice    Multiple Pain Sites  No         [facility] PT Assessment - 03/15/19 0001      AROM   Overall AROM Comments  Inversion 22 degrees, DF -5, PF 35 degrees      PROM   Right Ankle Dorsiflexion  10    Right Ankle Plantar Flexion  45                    [facility] Adult PT Treatment/Exercise - 03/15/19 0001      Vasopneumatic   Number Minutes Vasopneumatic   15 minutes    Vasopnuematic Location   Ankle    Vasopneumatic Pressure  Medium    Vasopneumatic Temperature   3 snowflakes      Manual Therapy   Joint Mobilization  Gd 3 talcrul mobs 3x 20sec     Soft tissue mobilization  scar mobilization   gastroc and achilles   Passive ROM  Rt ankle DF, PF, inversion, Rt great toe extension/flexion      Ankle Exercises: Aerobic   Nustep  L1 x 5 min with discussion of status      Ankle Exercises: Seated   BAPS  Sitting;Level 3    BAPS Limitations  20x each direction, VC to keep heel down    PTA min assist to control board     Ankle Exercises: Supine   T-Band  Red DF/PF 20x each    Other Supine Ankle Exercises  Ankle Ev/INV with manual resistance 20x  PT Short Term Goals - 03/06/19 1418      PT SHORT TERM GOAL #1   Title  be independent in initial HEP    Status  Achieved        PT Long Term Goals - 03/01/19 1211      PT LONG TERM GOAL #1   Title  be independent in advanced HEP    Time  12    Period  Weeks    Status  New    Target Date  04/26/19      PT LONG TERM GOAL #2   Title  reduce FOTO to < or 34% limitation    Time  12    Period  Weeks    Status  New    Target Date  05/24/19      PT LONG TERM GOAL #3   Title  demonstrate 4 to 4+/5 Rt ankle strength to improve stability with gait on unlevel surfaces    Time  12    Period  Weeks    Status  New    Target Date  05/24/19      PT LONG TERM GOAL #4   Title  demonstrate Rt ankle A/ROM DF to 8 degrees to normalize gait    Time  12    Period  Weeks    Status  New    Target Date  05/24/19      PT LONG TERM GOAL #5   Title  ambulate on level surfaces without boot and demonstrate symmetry    Time  12    Period  Weeks    Status  New    Target Date  05/24/19      PT LONG TERM GOAL #6   Title  improve strength and  endurance to stand and walk for 1 hour to allow for return to independence at home and in the community    Time  12    Period  Weeks    Status  New    Target Date  05/24/19            Plan - 03/15/19 1406    Clinical Impression Statement  Pt arrives to PT today essentially in no pain. She is beginnng to be aggrevated by the boot. PTA advised sending a message to her MD to inquire about how long she needs to be in the boot. A/PROM of RT ankle improving.    Examination-Activity Limitations  Locomotion Level;Transfers;Stand;Stairs;Squat    Examination-Participation Restrictions  Meal Prep    Stability/Clinical Decision Making  Stable/Uncomplicated    Rehab Potential  Excellent    PT Frequency  2x / week    PT Duration  12 weeks    PT Treatment/Interventions  ADLs/Self Care Home Management;Cryotherapy;Electrical Stimulation;Gait training;Moist Heat;Functional mobility training;Therapeutic activities;Therapeutic exercise;Neuromuscular re-education;Patient/family education;Manual techniques;Scar mobilization;Passive range of motion;Taping;Vasopneumatic Device    PT Next Visit Plan  Work on active and passive dorsiflexion, tband 4 ways, scar mobs, Game Ready    PT Home Exercise Plan  Access Code: [email or web address]    Consulted and Agree with Plan of Care  Patient       Patient will benefit from skilled therapeutic intervention in order to improve the following deficits and impairments:  Abnormal gait, Decreased activity tolerance, Decreased mobility, Decreased endurance, Difficulty walking, Decreased scar mobility, Decreased strength, Increased edema, Increased muscle spasms, Impaired flexibility, Pain  Visit Diagnosis: Stiffness of right ankle, not elsewhere classified  Other abnormalities of gait and mobility  Muscle weakness (  generalized)  Localized edema  Pain in right ankle and joints of right foot     Problem List Patient Active Problem List   Diagnosis Date Noted  . Bimalleolar  fracture of right ankle 01/18/2019  . Closed right ankle fracture 01/18/2019  . Fibroid 09/23/2011  . Short stature 09/23/2011    Kimiyo Carmicheal, PTA 03/15/2019, 3:35 PM  [facility] [address], [place], [place], [address] Phone: [phone]   Fax:  [phone]  Name: Jerni Darwin MRN: [id number] Date of Birth: 04/17/1991

## 2019-03-16 NOTE — Telephone Encounter (Signed)
[place] for intermittent FMLA to attend PT visits?  For how long?

## 2019-03-20 ENCOUNTER — Encounter: Payer: BLUE CROSS/BLUE SHIELD | Admitting: Physical Therapy

## 2019-03-21 ENCOUNTER — Telehealth: Payer: Self-pay | Admitting: Orthopaedic Surgery

## 2019-03-21 NOTE — Telephone Encounter (Signed)
Ok for intermittant FLMA for several weeks while she is doing therapy thanks

## 2019-03-21 NOTE — Telephone Encounter (Signed)
Patient called. She would like to get a handicap sign. Her call back number is [phone]

## 2019-03-22 ENCOUNTER — Encounter: Payer: Self-pay | Admitting: Physical Therapy

## 2019-03-22 ENCOUNTER — Other Ambulatory Visit: Payer: Self-pay

## 2019-03-22 ENCOUNTER — Ambulatory Visit: Payer: BLUE CROSS/BLUE SHIELD | Attending: Orthopaedic Surgery | Admitting: Physical Therapy

## 2019-03-22 ENCOUNTER — Encounter: Payer: Self-pay | Admitting: Orthopaedic Surgery

## 2019-03-22 DIAGNOSIS — R2689 Other abnormalities of gait and mobility: Secondary | ICD-10-CM | POA: Diagnosis present

## 2019-03-22 DIAGNOSIS — M25671 Stiffness of right ankle, not elsewhere classified: Secondary | ICD-10-CM | POA: Insufficient documentation

## 2019-03-22 DIAGNOSIS — R600 Localized edema: Secondary | ICD-10-CM | POA: Diagnosis present

## 2019-03-22 DIAGNOSIS — M6281 Muscle weakness (generalized): Secondary | ICD-10-CM | POA: Diagnosis present

## 2019-03-22 DIAGNOSIS — M25571 Pain in right ankle and joints of right foot: Secondary | ICD-10-CM | POA: Diagnosis present

## 2019-03-22 NOTE — Therapy (Signed)
[facility] [address] [place], [place], [address] Phone: [phone]   Fax:  [phone]  Physical Therapy Treatment  Patient Details  Name: Rita Long MRN: [id number] Date of Birth: September 16, 1990 Referring Provider (PT): Rodell Perna, MD   Encounter Date: 03/22/2019  PT End of Session - 03/22/19 1449    Visit Number  6    Date for PT Re-Evaluation  05/24/19    Authorization Type  BCBS    PT Start Time  1450    PT Stop Time  1540    PT Time Calculation (min)  50 min    Activity Tolerance  Patient tolerated treatment well    Behavior During Therapy  [facility] for tasks assessed/performed       Past Medical History:  Diagnosis Date  . Fibroid    "unsure"    Past Surgical History:  Procedure Laterality Date  . CESAREAN SECTION  07/01/2011   Procedure: CESAREAN SECTION;  Surgeon: Alwyn Pea, MD;  Location: [facility];  Service: Gynecology;  Laterality: N/A;  . ORIF ANKLE FRACTURE Right 01/18/2019   Procedure: OPEN REDUCTION INTERNAL FIXATION (ORIF) RIGHT ANKLE( BI-MALEOLAR-POSTERIOR AND LATERAL);  Surgeon: Marybelle Killings, MD;  Location: [facility];  Service: Orthopedics;  Laterality: Right;  POPLITEAL BLOCK  . WISDOM TOOTH EXTRACTION  2015    There were no vitals filed for this visit.  Subjective Assessment - 03/22/19 1451    Subjective  Pt reports she has done some driving and felt good about short trips. She also reports walking in her boot around the house without crutches and having no pain.    Currently in Pain?  No/denies    Multiple Pain Sites  No                       [facility] Adult PT Treatment/Exercise - 03/22/19 0001      Vasopneumatic   Number Minutes Vasopneumatic   15 minutes    Vasopnuematic Location   Ankle    Vasopneumatic Pressure  Medium    Vasopneumatic Temperature   3 snowflakes      Manual Therapy   Joint Mobilization  Gd 3 talcrul mobs 3x 20sec     Soft tissue  mobilization  scar mobilization   gastroc and achilles   Passive ROM  Rt ankle DF, PF, inversion, Rt great toe extension/flexion      Ankle Exercises: Aerobic   Nustep  L2 x 6 min       Ankle Exercises: Seated   BAPS  Sitting;Level 4    BAPS Limitations  20x each direction, VC to keep heel down    improved control, mildly wobbly     Ankle Exercises: Stretches   Other Stretch  Towel stretch 4x 20 sec for dorsiflexion               PT Short Term Goals - 03/22/19 1458      PT SHORT TERM GOAL #4   Title  ambulate on level surface with 1 crutch an full weightbearing on Rt LE    Time  4    Period  Weeks    Status  Partially Met   Pt walking without crutches at home in boot. Using crutches for longer community walking       PT Long Term Goals - 03/01/19 1211      PT LONG TERM GOAL #1   Title  be independent in advanced HEP  Time  12    Period  Weeks    Status  New    Target Date  04/26/19      PT LONG TERM GOAL #2   Title  reduce FOTO to < or 34% limitation    Time  12    Period  Weeks    Status  New    Target Date  05/24/19      PT LONG TERM GOAL #3   Title  demonstrate 4 to 4+/5 Rt ankle strength to improve stability with gait on unlevel surfaces    Time  12    Period  Weeks    Status  New    Target Date  05/24/19      PT LONG TERM GOAL #4   Title  demonstrate Rt ankle A/ROM DF to 8 degrees to normalize gait    Time  12    Period  Weeks    Status  New    Target Date  05/24/19      PT LONG TERM GOAL #5   Title  ambulate on level surfaces without boot and demonstrate symmetry    Time  12    Period  Weeks    Status  New    Target Date  05/24/19      PT LONG TERM GOAL #6   Title  improve strength and endurance to stand and walk for 1 hour to allow for return to independence at home and in the community    Time  12    Period  Weeks    Status  New    Target Date  05/24/19            Plan - 03/22/19 1455    Clinical Impression Statement   Pt arrives pain free, reports trying short driving stints and walking around her house in the boot without crutches. Pt struggles with the dorsiflexion portion of any exercise due to tightness most likely in the ankle joint. She probably shoulde have stayed on L3 for BAPS circles as pt struggled to control the board. Not the case for DF/PF and Inv/EV. Pt tolerates joint mobs well, no pain. Great toe mobility appears to be improving.    Examination-Activity Limitations  Locomotion Level;Transfers;Stand;Stairs;Squat    Examination-Participation Restrictions  Meal Prep    Stability/Clinical Decision Making  Stable/Uncomplicated    Rehab Potential  Excellent    PT Frequency  2x / week    PT Duration  12 weeks    PT Treatment/Interventions  ADLs/Self Care Home Management;Cryotherapy;Electrical Stimulation;Gait training;Moist Heat;Functional mobility training;Therapeutic activities;Therapeutic exercise;Neuromuscular re-education;Patient/family education;Manual techniques;Scar mobilization;Passive range of motion;Taping;Vasopneumatic Device    PT Next Visit Plan  Work on active and passive dorsiflexion, joint mobs for DF,  tband 4 ways, scar mobs, Game Ready    PT Home Exercise Plan  Access Code: [email or web address]    Consulted and Agree with Plan of Care  Patient       Patient will benefit from skilled therapeutic intervention in order to improve the following deficits and impairments:  Abnormal gait, Decreased activity tolerance, Decreased mobility, Decreased endurance, Difficulty walking, Decreased scar mobility, Decreased strength, Increased edema, Increased muscle spasms, Impaired flexibility, Pain  Visit Diagnosis: Stiffness of right ankle, not elsewhere classified  Other abnormalities of gait and mobility  Muscle weakness (generalized)  Localized edema  Pain in right ankle and joints of right foot     Problem List Patient Active Problem List   Diagnosis Date Noted  .  Bimalleolar fracture of  right ankle 01/18/2019  . Closed right ankle fracture 01/18/2019  . Fibroid 09/23/2011  . Short stature 09/23/2011    Neveen Daponte, PTA 03/22/2019, 4:02 PM  [facility] [address] [place], [place], [address] Phone: [phone]   Fax:  [phone]  Name: Magdalene Tardiff MRN: [id number] Date of Birth: 1991/01/01

## 2019-03-22 NOTE — Telephone Encounter (Signed)
[place] for placard?

## 2019-03-22 NOTE — Therapy (Signed)
[facility] [address] [place], [place], [address] Phone: [phone]   Fax:  [phone]  Physical Therapy Treatment  Patient Details  Name: Rita Long MRN: [id number] Date of Birth: September 16, 1990 Referring Provider (PT): Rodell Perna, MD   Encounter Date: 03/22/2019  PT End of Session - 03/22/19 1449    Visit Number  6    Date for PT Re-Evaluation  05/24/19    Authorization Type  BCBS    PT Start Time  1450    PT Stop Time  1540    PT Time Calculation (min)  50 min    Activity Tolerance  Patient tolerated treatment well    Behavior During Therapy  [facility] for tasks assessed/performed       Past Medical History:  Diagnosis Date  . Fibroid    "unsure"    Past Surgical History:  Procedure Laterality Date  . CESAREAN SECTION  07/01/2011   Procedure: CESAREAN SECTION;  Surgeon: Alwyn Pea, MD;  Location: [facility];  Service: Gynecology;  Laterality: N/A;  . ORIF ANKLE FRACTURE Right 01/18/2019   Procedure: OPEN REDUCTION INTERNAL FIXATION (ORIF) RIGHT ANKLE( BI-MALEOLAR-POSTERIOR AND LATERAL);  Surgeon: Marybelle Killings, MD;  Location: [facility];  Service: Orthopedics;  Laterality: Right;  POPLITEAL BLOCK  . WISDOM TOOTH EXTRACTION  2015    There were no vitals filed for this visit.  Subjective Assessment - 03/22/19 1451    Subjective  Pt reports she has done some driving and felt good about short trips. She also reports walking in her boot around the house without crutches and having no pain.    Currently in Pain?  No/denies    Multiple Pain Sites  No                       [facility] Adult PT Treatment/Exercise - 03/22/19 0001      Vasopneumatic   Number Minutes Vasopneumatic   15 minutes    Vasopnuematic Location   Ankle    Vasopneumatic Pressure  Medium    Vasopneumatic Temperature   3 snowflakes      Manual Therapy   Joint Mobilization  Gd 3 talcrul mobs 3x 20sec     Soft tissue  mobilization  scar mobilization   gastroc and achilles   Passive ROM  Rt ankle DF, PF, inversion, Rt great toe extension/flexion      Ankle Exercises: Aerobic   Nustep  L2 x 6 min       Ankle Exercises: Seated   BAPS  Sitting;Level 4    BAPS Limitations  20x each direction, VC to keep heel down    improved control, mildly wobbly     Ankle Exercises: Stretches   Other Stretch  Towel stretch 4x 20 sec for dorsiflexion               PT Short Term Goals - 03/22/19 1458      PT SHORT TERM GOAL #4   Title  ambulate on level surface with 1 crutch an full weightbearing on Rt LE    Time  4    Period  Weeks    Status  Partially Met   Pt walking without crutches at home in boot. Using crutches for longer community walking       PT Long Term Goals - 03/01/19 1211      PT LONG TERM GOAL #1   Title  be independent in advanced HEP  Time  12    Period  Weeks    Status  New    Target Date  04/26/19      PT LONG TERM GOAL #2   Title  reduce FOTO to < or 34% limitation    Time  12    Period  Weeks    Status  New    Target Date  05/24/19      PT LONG TERM GOAL #3   Title  demonstrate 4 to 4+/5 Rt ankle strength to improve stability with gait on unlevel surfaces    Time  12    Period  Weeks    Status  New    Target Date  05/24/19      PT LONG TERM GOAL #4   Title  demonstrate Rt ankle A/ROM DF to 8 degrees to normalize gait    Time  12    Period  Weeks    Status  New    Target Date  05/24/19      PT LONG TERM GOAL #5   Title  ambulate on level surfaces without boot and demonstrate symmetry    Time  12    Period  Weeks    Status  New    Target Date  05/24/19      PT LONG TERM GOAL #6   Title  improve strength and endurance to stand and walk for 1 hour to allow for return to independence at home and in the community    Time  12    Period  Weeks    Status  New    Target Date  05/24/19            Plan - 03/22/19 1455    Clinical Impression Statement   Pt arrives pain free, reports trying short driving stints and walking around her house in the boot without crutches. Pt struggles with the dorsiflexion portion of any exercise due to tightness most likely in th eankle joint.    Examination-Activity Limitations  Locomotion Level;Transfers;Stand;Stairs;Squat    Examination-Participation Restrictions  Meal Prep    Stability/Clinical Decision Making  Stable/Uncomplicated    Rehab Potential  Excellent    PT Frequency  2x / week    PT Duration  12 weeks    PT Treatment/Interventions  ADLs/Self Care Home Management;Cryotherapy;Electrical Stimulation;Gait training;Moist Heat;Functional mobility training;Therapeutic activities;Therapeutic exercise;Neuromuscular re-education;Patient/family education;Manual techniques;Scar mobilization;Passive range of motion;Taping;Vasopneumatic Device    PT Next Visit Plan  Work on active and passive dorsiflexion, joint mobs for DF,  tband 4 ways, scar mobs, Game Ready    PT Home Exercise Plan  Access Code: [email or web address]    Consulted and Agree with Plan of Care  Patient       Patient will benefit from skilled therapeutic intervention in order to improve the following deficits and impairments:  Abnormal gait, Decreased activity tolerance, Decreased mobility, Decreased endurance, Difficulty walking, Decreased scar mobility, Decreased strength, Increased edema, Increased muscle spasms, Impaired flexibility, Pain  Visit Diagnosis: Stiffness of right ankle, not elsewhere classified  Other abnormalities of gait and mobility  Muscle weakness (generalized)  Localized edema  Pain in right ankle and joints of right foot     Problem List Patient Active Problem List   Diagnosis Date Noted  . Bimalleolar fracture of right ankle 01/18/2019  . Closed right ankle fracture 01/18/2019  . Fibroid 09/23/2011  . Short stature 09/23/2011    COCHRAN,JENNIFER, PTA 03/22/2019, 3:25 PM  [facility] [address], [place], [place], [address] Phone: [phone]   Fax:  [phone]  Name: Rita Long MRN: [id number] Date of Birth: 1991/05/02

## 2019-03-23 NOTE — Telephone Encounter (Signed)
Okay to give one for 3 months

## 2019-03-23 NOTE — Telephone Encounter (Signed)
Noted. Added to [facility] paperwork sent over by [employer]. Have patient approved for intermittent FMLA until 05/16/2019.

## 2019-03-24 ENCOUNTER — Ambulatory Visit: Payer: BLUE CROSS/BLUE SHIELD | Admitting: Physical Therapy

## 2019-03-24 ENCOUNTER — Telehealth: Payer: Self-pay | Admitting: Orthopaedic Surgery

## 2019-03-24 ENCOUNTER — Encounter: Payer: Self-pay | Admitting: Physical Therapy

## 2019-03-24 ENCOUNTER — Other Ambulatory Visit: Payer: Self-pay

## 2019-03-24 ENCOUNTER — Encounter

## 2019-03-24 DIAGNOSIS — M25671 Stiffness of right ankle, not elsewhere classified: Secondary | ICD-10-CM | POA: Diagnosis not present

## 2019-03-24 DIAGNOSIS — M6281 Muscle weakness (generalized): Secondary | ICD-10-CM

## 2019-03-24 DIAGNOSIS — R600 Localized edema: Secondary | ICD-10-CM

## 2019-03-24 DIAGNOSIS — M25571 Pain in right ankle and joints of right foot: Secondary | ICD-10-CM

## 2019-03-24 DIAGNOSIS — R2689 Other abnormalities of gait and mobility: Secondary | ICD-10-CM

## 2019-03-24 NOTE — Telephone Encounter (Signed)
Can you please advise?

## 2019-03-24 NOTE — Therapy (Signed)
[facility] [address], [place], [place], [address] Phone: [phone]   Fax:  [phone]  Physical Therapy Treatment  Patient Details  Name: Rita Long MRN: [id number] Date of Birth: Oct 23, 1990 Referring Provider (PT): Rodell Perna, MD   Encounter Date: 03/24/2019  PT End of Session - 03/24/19 1119    Visit Number  7    Date for PT Re-Evaluation  05/24/19    Authorization Type  BCBS    PT Start Time  1028    PT Stop Time  1130    PT Time Calculation (min)  62 min    Activity Tolerance  Patient tolerated treatment well       Past Medical History:  Diagnosis Date  . Fibroid    "unsure"    Past Surgical History:  Procedure Laterality Date  . CESAREAN SECTION  07/01/2011   Procedure: CESAREAN SECTION;  Surgeon: Alwyn Pea, MD;  Location: [facility];  Service: Gynecology;  Laterality: N/A;  . ORIF ANKLE FRACTURE Right 01/18/2019   Procedure: OPEN REDUCTION INTERNAL FIXATION (ORIF) RIGHT ANKLE( BI-MALEOLAR-POSTERIOR AND LATERAL);  Surgeon: Marybelle Killings, MD;  Location: [facility];  Service: Orthopedics;  Laterality: Right;  POPLITEAL BLOCK  . WISDOM TOOTH EXTRACTION  2015    There were no vitals filed for this visit.  Subjective Assessment - 03/24/19 1029    Subjective  I've been walking around the house without my boot on.  Presents to PT with boot  and 2 crutches.  Patient states she has called the doctor's office and sent MyChart message regarding discontinuing the boot.    Currently in Pain?  Yes    Pain Score  2     Pain Location  Ankle    Pain Orientation  Right    Pain Type  Surgical pain                       [facility] Adult PT Treatment/Exercise - 03/24/19 0001      Vasopneumatic   Number Minutes Vasopneumatic   15 minutes    Vasopnuematic Location   Ankle    Vasopneumatic Pressure  Medium    Vasopneumatic Temperature   3 snowflakes      Manual Therapy   Joint Mobilization  Gd  3 talcrul mobs 3x 20sec  supine and prone     Soft tissue mobilization  --    Passive ROM  Rt ankle DF, PF, inversion, Rt great toe extension/flexion supine and prone       Ankle Exercises: Aerobic   Nustep  L2 x 6 min       Ankle Exercises: Seated   BAPS  Sitting;Level 3    BAPS Limitations  20x each direction, VC to keep heel down     Other Seated Ankle Exercises  floor slider to encourage ankle dorsiflexion 20x       Ankle Exercises: Standing   Other Standing Ankle Exercises  with crutches staggered stand rocking with emphasis on keeping heel down 25x      Ankle Exercises: Stretches   Other Stretch  standing with 2 crutches, toes on 2 inch step pressing heel down 3x 20 sec                PT Short Term Goals - 03/22/19 1458      PT SHORT TERM GOAL #4   Title  ambulate on level surface with 1 crutch an full weightbearing on Rt  LE    Time  4    Period  Weeks    Status  Partially Met   Pt walking without crutches at home in boot. Using crutches for longer community walking       PT Long Term Goals - 03/01/19 1211      PT LONG TERM GOAL #1   Title  be independent in advanced HEP    Time  12    Period  Weeks    Status  New    Target Date  04/26/19      PT LONG TERM GOAL #2   Title  reduce FOTO to < or 34% limitation    Time  12    Period  Weeks    Status  New    Target Date  05/24/19      PT LONG TERM GOAL #3   Title  demonstrate 4 to 4+/5 Rt ankle strength to improve stability with gait on unlevel surfaces    Time  12    Period  Weeks    Status  New    Target Date  05/24/19      PT LONG TERM GOAL #4   Title  demonstrate Rt ankle A/ROM DF to 8 degrees to normalize gait    Time  12    Period  Weeks    Status  New    Target Date  05/24/19      PT LONG TERM GOAL #5   Title  ambulate on level surfaces without boot and demonstrate symmetry    Time  12    Period  Weeks    Status  New    Target Date  05/24/19      PT LONG TERM GOAL #6   Title   improve strength and endurance to stand and walk for 1 hour to allow for return to independence at home and in the community    Time  12    Period  Weeks    Status  New    Target Date  05/24/19            Plan - 03/24/19 1120    Clinical Impression Statement  At patient's request called MD office for clarification of boot status and WB status.  Receptionist to give message to the nurse.  Treatment focus on increasing ankle dorsiflexion and soft tissue lengthening of gastroc/achilles.  Moderate verbal cues to avoid compenstory toe out positioning or lifting the heel with exercises.  Encouraged frequent stretching at home every 2 hours to prepare for weaning from boot when MD approves.    Examination-Activity Limitations  Locomotion Level;Transfers;Stand;Stairs;Squat    Examination-Participation Restrictions  Meal Prep    Rehab Potential  Excellent    PT Frequency  2x / week    PT Duration  12 weeks    PT Treatment/Interventions  ADLs/Self Care Home Management;Cryotherapy;Electrical Stimulation;Gait training;Moist Heat;Functional mobility training;Therapeutic activities;Therapeutic exercise;Neuromuscular re-education;Patient/family education;Manual techniques;Scar mobilization;Passive range of motion;Taping;Vasopneumatic Device    PT Next Visit Plan  Work on active and passive dorsiflexion, joint mobs for DF,  tband 4 ways, scar mobs, Game Ready; check progress towards goals next week; awaiting for MD clarification of boot status    PT Home Exercise Plan  Access Code: [email or web address]       Patient will benefit from skilled therapeutic intervention in order to improve the following deficits and impairments:  Abnormal gait, Decreased activity tolerance, Decreased mobility, Decreased endurance, Difficulty walking, Decreased scar mobility, Decreased strength, Increased  edema, Increased muscle spasms, Impaired flexibility, Pain  Visit Diagnosis: Stiffness of right ankle, not elsewhere  classified  Other abnormalities of gait and mobility  Muscle weakness (generalized)  Localized edema  Pain in right ankle and joints of right foot     Problem List Patient Active Problem List   Diagnosis Date Noted  . Bimalleolar fracture of right ankle 01/18/2019  . Closed right ankle fracture 01/18/2019  . Fibroid 09/23/2011  . Short stature 09/23/2011   Ruben Im, PT 03/24/19 11:28 AM Phone: [phone] Fax: [phone] Alvera Singh 03/24/2019, 11:27 AM  [facility] [address], [place], [place], [address] Phone: [phone]   Fax:  [phone]  Name: Nyiah Pianka MRN: [id number] Date of Birth: July 15, 1990

## 2019-03-24 NOTE — Telephone Encounter (Signed)
Awaiting signature from [place].

## 2019-03-24 NOTE — Telephone Encounter (Signed)
Stacy from [facility] called. She is the PT working with the patient. She would like to know the WB status and if she is able to come out of the boot. Her call back number is [phone]

## 2019-03-26 NOTE — Telephone Encounter (Signed)
WBAT, needs heelcord streching as well thanks ucall .

## 2019-03-27 NOTE — Telephone Encounter (Signed)
I called Rita Long to advise handicap placard is ready for pick up.  Rita Long would like for me to ask Dr. Lorin Mercy if she is able to come out of the boot and try to walk around without it using crutches at home. She states that her sister thought it would be a good idea to come out of the boot some. I explained to Rita Long that I would send Dr. Lorin Mercy message to address, but did ask her to stay in boot until otherwise told. She expressed understanding.  Please advise.

## 2019-03-27 NOTE — Telephone Encounter (Signed)
I called [name] and advised.

## 2019-03-28 NOTE — Telephone Encounter (Signed)
I called her had discussed as I have discussed with her the last 2 times thanks

## 2019-03-29 ENCOUNTER — Other Ambulatory Visit: Payer: Self-pay

## 2019-03-29 ENCOUNTER — Ambulatory Visit: Payer: BLUE CROSS/BLUE SHIELD

## 2019-03-29 DIAGNOSIS — M25671 Stiffness of right ankle, not elsewhere classified: Secondary | ICD-10-CM | POA: Diagnosis not present

## 2019-03-29 DIAGNOSIS — M6281 Muscle weakness (generalized): Secondary | ICD-10-CM

## 2019-03-29 DIAGNOSIS — R600 Localized edema: Secondary | ICD-10-CM

## 2019-03-29 DIAGNOSIS — R2689 Other abnormalities of gait and mobility: Secondary | ICD-10-CM

## 2019-03-29 DIAGNOSIS — M25571 Pain in right ankle and joints of right foot: Secondary | ICD-10-CM

## 2019-03-29 NOTE — Therapy (Signed)
[facility] [address], [place], [place], [address] Phone: [phone]   Fax:  [phone]  Physical Therapy Treatment  Patient Details  Name: Rita Long MRN: [id number] Date of Birth: 11/01/1990 Referring Provider (PT): Rodell Perna, MD   Encounter Date: 03/29/2019  PT End of Session - 03/29/19 1450    Visit Number  8    Date for PT Re-Evaluation  05/24/19    Authorization Type  BCBS    PT Start Time  1402    PT Stop Time  1500    PT Time Calculation (min)  58 min    Activity Tolerance  Patient tolerated treatment well    Behavior During Therapy  [facility] for tasks assessed/performed       Past Medical History:  Diagnosis Date  . Fibroid    "unsure"    Past Surgical History:  Procedure Laterality Date  . CESAREAN SECTION  07/01/2011   Procedure: CESAREAN SECTION;  Surgeon: Alwyn Pea, MD;  Location: [facility];  Service: Gynecology;  Laterality: N/A;  . ORIF ANKLE FRACTURE Right 01/18/2019   Procedure: OPEN REDUCTION INTERNAL FIXATION (ORIF) RIGHT ANKLE( BI-MALEOLAR-POSTERIOR AND LATERAL);  Surgeon: Marybelle Killings, MD;  Location: [facility];  Service: Orthopedics;  Laterality: Right;  POPLITEAL BLOCK  . WISDOM TOOTH EXTRACTION  2015    There were no vitals filed for this visit.  Subjective Assessment - 03/29/19 1413    Subjective  Dr Lorin Mercy called me and told me that I am WBAT and it is OK to come out of the boot when in PT.  I am supposed to wear the boot at home.    Currently in Pain?  No/denies         [facility] PT Assessment - 03/29/19 0001      ROM / Strength   AROM / PROM / Strength  AROM      AROM   AROM Assessment Site  Ankle    Right/Left Ankle  Right    Right Ankle Dorsiflexion  -5      PROM   Right Ankle Dorsiflexion  10                   [facility] Adult PT Treatment/Exercise - 03/29/19 0001      Ambulation/Gait   Gait Comments  Gait with 2 crutches with emphasis on heel strike  on the Rt      Exercises   Exercises  Knee/Hip      Knee/Hip Exercises: Standing   Stairs  Lt foot tap on 6" step with weightbearing on the Rt LE   verbal cues to reduce hyperextension of Rt LE   Rebounder  weight shifting 3 ways x 1 minute each    Other Standing Knee Exercises  standing weight shift: medial/lateral, anterior/posterior with 1 UE support      Vasopneumatic   Number Minutes Vasopneumatic   15 minutes    Vasopnuematic Location   Ankle    Vasopneumatic Pressure  Medium    Vasopneumatic Temperature   3 snowflakes      Manual Therapy   Joint Mobilization  Gd 3 talcrul mobs 3x 20sec  supine and prone     Passive ROM  Rt ankle DF, PF, inversion, Rt great toe extension/flexion supine and prone       Ankle Exercises: Aerobic   Nustep  L2 x 6 min       Ankle Exercises: Stretches   Press photographer  3 reps;20 seconds   standing at edge of step   Other Stretch  Prostretch: standing x 3 minutes on Rt               PT Short Term Goals - 03/29/19 1419      PT SHORT TERM GOAL #4   Title  ambulate on level surface with 1 crutch an full weightbearing on Rt LE    Baseline  WBAT with bil crutches    Status  On-going        PT Long Term Goals - 03/01/19 1211      PT LONG TERM GOAL #1   Title  be independent in advanced HEP    Time  12    Period  Weeks    Status  New    Target Date  04/26/19      PT LONG TERM GOAL #2   Title  reduce FOTO to < or 34% limitation    Time  12    Period  Weeks    Status  New    Target Date  05/24/19      PT LONG TERM GOAL #3   Title  demonstrate 4 to 4+/5 Rt ankle strength to improve stability with gait on unlevel surfaces    Time  12    Period  Weeks    Status  New    Target Date  05/24/19      PT LONG TERM GOAL #4   Title  demonstrate Rt ankle A/ROM DF to 8 degrees to normalize gait    Time  12    Period  Weeks    Status  New    Target Date  05/24/19      PT LONG TERM GOAL #5   Title  ambulate on level surfaces  without boot and demonstrate symmetry    Time  12    Period  Weeks    Status  New    Target Date  05/24/19      PT LONG TERM GOAL #6   Title  improve strength and endurance to stand and walk for 1 hour to allow for return to independence at home and in the community    Time  12    Period  Weeks    Status  New    Target Date  05/24/19            Plan - 03/29/19 1459    Clinical Impression Statement  Session today worked on heel strike with gait on level surface with use of 2 crutches, weight shifting out of the boot and gastroc flexibility.  Pt has difficulty with weightbearing into the Lt LE without hyperextension at the Rt knee.  Pt provided tactile and demo/verbal cues and pt was able to correct with practice.  Pt demonstrated improved heel strike on Rt at the end of the session and will work on this at home.  Pt remains limited with Rt ankle A/ROM into DF and pt will continue to work on flexibility at home.  Pt will continue to benefit from skilled PT for Rt LE strength, weightbearing/proprioception, manual and edema management.    PT Frequency  2x / week    PT Duration  12 weeks    PT Treatment/Interventions  ADLs/Self Care Home Management;Cryotherapy;Electrical Stimulation;Gait training;Moist Heat;Functional mobility training;Therapeutic activities;Therapeutic exercise;Neuromuscular re-education;Patient/family education;Manual techniques;Scar mobilization;Passive range of motion;Taping;Vasopneumatic Device    PT Next Visit Plan  work on Rt ankle A/ROM and P/ROM, work on  heel strike with gait out of boot, Rt knee strength in standing without hyperextension    PT Home Exercise Plan  Access Code: [email or web address]    Recommended Other Services  initial certification is signed    Consulted and Agree with Plan of Care  Patient       Patient will benefit from skilled therapeutic intervention in order to improve the following deficits and impairments:  Abnormal gait, Decreased activity  tolerance, Decreased mobility, Decreased endurance, Difficulty walking, Decreased scar mobility, Decreased strength, Increased edema, Increased muscle spasms, Impaired flexibility, Pain  Visit Diagnosis: Stiffness of right ankle, not elsewhere classified  Other abnormalities of gait and mobility  Muscle weakness (generalized)  Localized edema  Pain in right ankle and joints of right foot     Problem List Patient Active Problem List   Diagnosis Date Noted  . Bimalleolar fracture of right ankle 01/18/2019  . Closed right ankle fracture 01/18/2019  . Fibroid 09/23/2011  . Short stature 09/23/2011    Sigurd Sos, PT 03/29/19 3:02 PM  [facility] [address], [place], [place], [address] Phone: [phone]   Fax:  [phone]  Name: Shaniqwa Leitz MRN: [id number] Date of Birth: 02-26-1991

## 2019-03-29 NOTE — Telephone Encounter (Signed)
noted 

## 2019-03-31 ENCOUNTER — Other Ambulatory Visit: Payer: Self-pay

## 2019-03-31 ENCOUNTER — Encounter: Payer: Self-pay | Admitting: Physical Therapy

## 2019-03-31 ENCOUNTER — Ambulatory Visit: Payer: BLUE CROSS/BLUE SHIELD | Admitting: Physical Therapy

## 2019-03-31 DIAGNOSIS — M25671 Stiffness of right ankle, not elsewhere classified: Secondary | ICD-10-CM | POA: Diagnosis not present

## 2019-03-31 DIAGNOSIS — M6281 Muscle weakness (generalized): Secondary | ICD-10-CM

## 2019-03-31 DIAGNOSIS — R2689 Other abnormalities of gait and mobility: Secondary | ICD-10-CM

## 2019-03-31 DIAGNOSIS — R600 Localized edema: Secondary | ICD-10-CM

## 2019-03-31 DIAGNOSIS — M25571 Pain in right ankle and joints of right foot: Secondary | ICD-10-CM

## 2019-03-31 NOTE — Therapy (Signed)
[facility] [address] [place], [place], [address] Phone: [phone]   Fax:  [phone]  Physical Therapy Treatment  Patient Details  Name: Rita Long MRN: [id number] Date of Birth: [birth year] Referring Provider (PT): Rodell Perna, MD   Encounter Date: 03/31/2019  PT End of Session - 03/31/19 0939    Visit Number  9    Date for PT Re-Evaluation  05/24/19    Authorization Type  BCBS    PT Start Time  0934    PT Stop Time  1035    PT Time Calculation (min)  61 min    Activity Tolerance  Patient tolerated treatment well    Behavior During Therapy  [facility] for tasks assessed/performed       Past Medical History:  Diagnosis Date  . Fibroid    "unsure"    Past Surgical History:  Procedure Laterality Date  . CESAREAN SECTION  07/01/2011   Procedure: CESAREAN SECTION;  Surgeon: Alwyn Pea, MD;  Location: [facility];  Service: Gynecology;  Laterality: N/A;  . ORIF ANKLE FRACTURE Right 01/18/2019   Procedure: OPEN REDUCTION INTERNAL FIXATION (ORIF) RIGHT ANKLE( BI-MALEOLAR-POSTERIOR AND LATERAL);  Surgeon: Marybelle Killings, MD;  Location: [facility];  Service: Orthopedics;  Laterality: Right;  POPLITEAL BLOCK  . WISDOM TOOTH EXTRACTION  2015    There were no vitals filed for this visit.  Subjective Assessment - 03/31/19 0938    Subjective  See MD 11/6, no complaints this AM    Patient Stated Goals  return to walking without device, return home to apartment, drive    Currently in Pain?  No/denies    Multiple Pain Sites  No                       [facility] Adult PT Treatment/Exercise - 03/31/19 0001      Knee/Hip Exercises: Standing   Rebounder  weight shifting 3 ways x 1 minute each      Vasopneumatic   Number Minutes Vasopneumatic   15 minutes    Vasopnuematic Location   Ankle    Vasopneumatic Pressure  Medium    Vasopneumatic Temperature   3 snowflakes      Manual Therapy   Joint Mobilization   Gd 3 talcrul mobs 4x 20sec  supine and prone     Soft tissue mobilization  Scar mobs laterally    Passive ROM  Rt ankle DF, PF, inversion, Rt great toe extension/flexion supine and prone       Ankle Exercises: Aerobic   Stationary Bike  L2 x 6 min: VC to put wt into balls of the feet on the pedal and maintain > 50 RPMS      Ankle Exercises: Stretches   Other Stretch  Prostretch: standing  4x 20 sec , used foam pad for LTLE      Ankle Exercises: Seated   BAPS  Sitting;Level 3    BAPS Limitations  20x Df/PF with PTA min asst to control board. 20x Inv/Ev with no assist.    Other Seated Ankle Exercises  red band dorsiflexion 2x15               PT Short Term Goals - 03/29/19 1419      PT SHORT TERM GOAL #4   Title  ambulate on level surface with 1 crutch an full weightbearing on Rt LE    Baseline  WBAT with bil crutches    Status  On-going        PT Long Term Goals - 03/01/19 1211      PT LONG TERM GOAL #1   Title  be independent in advanced HEP    Time  12    Period  Weeks    Status  New    Target Date  04/26/19      PT LONG TERM GOAL #2   Title  reduce FOTO to < or 34% limitation    Time  12    Period  Weeks    Status  New    Target Date  05/24/19      PT LONG TERM GOAL #3   Title  demonstrate 4 to 4+/5 Rt ankle strength to improve stability with gait on unlevel surfaces    Time  12    Period  Weeks    Status  New    Target Date  05/24/19      PT LONG TERM GOAL #4   Title  demonstrate Rt ankle A/ROM DF to 8 degrees to normalize gait    Time  12    Period  Weeks    Status  New    Target Date  05/24/19      PT LONG TERM GOAL #5   Title  ambulate on level surfaces without boot and demonstrate symmetry    Time  12    Period  Weeks    Status  New    Target Date  05/24/19      PT LONG TERM GOAL #6   Title  improve strength and endurance to stand and walk for 1 hour to allow for return to independence at home and in the community    Time  12    Period   Weeks    Status  New    Target Date  05/24/19            Plan - 03/31/19 0940    Clinical Impression Statement  Pt arrives pain free. Treatment split between weight shifting exercises using axillary crutches to maintain MD instructions and manual work to her scar ( pt may be developing keloid scar) and joint mobs/PROM for dorsiflexion. Pt's Rt ankle Df is 13 degrees active.    Examination-Activity Limitations  Locomotion Level;Transfers;Stand;Stairs;Squat    Examination-Participation Restrictions  Meal Prep    Stability/Clinical Decision Making  Stable/Uncomplicated    Rehab Potential  Excellent    PT Frequency  2x / week    PT Duration  12 weeks    PT Treatment/Interventions  ADLs/Self Care Home Management;Cryotherapy;Electrical Stimulation;Gait training;Moist Heat;Functional mobility training;Therapeutic activities;Therapeutic exercise;Neuromuscular re-education;Patient/family education;Manual techniques;Scar mobilization;Passive range of motion;Taping;Vasopneumatic Device    PT Next Visit Plan  work on Rt ankle A/ROM and P/ROM, work on heel strike with gait out of boot    PT Home Exercise Plan  Access Code: [email or web address]    Consulted and Agree with Plan of Care  Patient       Patient will benefit from skilled therapeutic intervention in order to improve the following deficits and impairments:  Abnormal gait, Decreased activity tolerance, Decreased mobility, Decreased endurance, Difficulty walking, Decreased scar mobility, Decreased strength, Increased edema, Increased muscle spasms, Impaired flexibility, Pain  Visit Diagnosis: Stiffness of right ankle, not elsewhere classified  Other abnormalities of gait and mobility  Muscle weakness (generalized)  Localized edema  Pain in right ankle and joints of right foot     Problem List Patient Active Problem List   Diagnosis Date  Noted  . Bimalleolar fracture of right ankle 01/18/2019  . Closed right ankle fracture 01/18/2019  .  Fibroid 09/23/2011  . Short stature 09/23/2011    Antionne Enrique, PTA 03/31/2019, 10:25 AM  [facility] [address] [place], [place], [address] Phone: [phone]   Fax:  [phone]  Name: Rita Long MRN: [id number] Date of Birth: Jun 08, 1991

## 2019-04-05 ENCOUNTER — Encounter: Payer: BLUE CROSS/BLUE SHIELD | Admitting: Physical Therapy

## 2019-04-05 ENCOUNTER — Ambulatory Visit: Payer: BLUE CROSS/BLUE SHIELD

## 2019-04-05 ENCOUNTER — Other Ambulatory Visit: Payer: Self-pay

## 2019-04-05 DIAGNOSIS — M25671 Stiffness of right ankle, not elsewhere classified: Secondary | ICD-10-CM | POA: Diagnosis not present

## 2019-04-05 DIAGNOSIS — R600 Localized edema: Secondary | ICD-10-CM

## 2019-04-05 DIAGNOSIS — M25571 Pain in right ankle and joints of right foot: Secondary | ICD-10-CM

## 2019-04-05 DIAGNOSIS — M6281 Muscle weakness (generalized): Secondary | ICD-10-CM

## 2019-04-05 DIAGNOSIS — R2689 Other abnormalities of gait and mobility: Secondary | ICD-10-CM

## 2019-04-05 NOTE — Therapy (Signed)
[facility] [address], [place], [place], [address] Phone: [phone]   Fax:  [phone]  Physical Therapy Treatment  Patient Details  Name: Rita Long MRN: [id number] Date of Birth: 1991-05-22 Referring Provider (PT): Rodell Perna, MD   Encounter Date: 04/05/2019  PT End of Session - 04/05/19 1616    Visit Number  10    Date for PT Re-Evaluation  05/24/19    Authorization Type  BCBS    PT Start Time  1532    PT Stop Time  1625    PT Time Calculation (min)  53 min    Activity Tolerance  Patient tolerated treatment well    Behavior During Therapy  [facility] for tasks assessed/performed       Past Medical History:  Diagnosis Date  . Fibroid    "unsure"    Past Surgical History:  Procedure Laterality Date  . CESAREAN SECTION  07/01/2011   Procedure: CESAREAN SECTION;  Surgeon: Alwyn Pea, MD;  Location: [facility];  Service: Gynecology;  Laterality: N/A;  . ORIF ANKLE FRACTURE Right 01/18/2019   Procedure: OPEN REDUCTION INTERNAL FIXATION (ORIF) RIGHT ANKLE( BI-MALEOLAR-POSTERIOR AND LATERAL);  Surgeon: Marybelle Killings, MD;  Location: [facility];  Service: Orthopedics;  Laterality: Right;  POPLITEAL BLOCK  . WISDOM TOOTH EXTRACTION  2015    There were no vitals filed for this visit.  Subjective Assessment - 04/05/19 1532    Subjective  I'm sore today.  Walking is about the same.    Patient Stated Goals  return to walking without device, return home to apartment, drive    Pain Score  2     Pain Location  Ankle    Pain Orientation  Right    Pain Descriptors / Indicators  Sore    Pain Type  Surgical pain    Pain Onset  More than a month ago    Pain Frequency  Intermittent    Aggravating Factors   walking, doing too much    Pain Relieving Factors  rest, non-weightbearing                       [facility] Adult PT Treatment/Exercise - 04/05/19 0001      Ambulation/Gait   Pre-Gait Activities  heel  strike, step through and toe off at countertop x 10 reps    Gait Comments  Gait with 2 crutches with emphasis on heel strike on the Rt.  Gait with 1 crutch on level surface.       Knee/Hip Exercises: Standing   Heel Raises  Both;2 sets;10 reps    Forward Step Up  Both;Right;2 sets;10 reps   verbal cues for active DF to keep foot from dropping   Other Standing Knee Exercises  toe raises x 10      Vasopneumatic   Number Minutes Vasopneumatic   10 minutes    Vasopnuematic Location   Ankle    Vasopneumatic Pressure  Medium    Vasopneumatic Temperature   3 snowflakes      Manual Therapy   Passive ROM  Rt ankle DF, PF, inversion, Rt great toe extension/flexion supine and prone       Ankle Exercises: Aerobic   Stationary Bike  L2 x 6 min: VC to put wt into balls of the feet on the pedal and maintain > 50 RPMS      Ankle Exercises: Seated   BAPS Limitations  20x Df/PF with PT min  asst to control board. 20x Inv/Ev with no assist.    Other Seated Ankle Exercises  red band dorsiflexion 2x15               PT Short Term Goals - 03/29/19 1419      PT SHORT TERM GOAL #4   Title  ambulate on level surface with 1 crutch an full weightbearing on Rt LE    Baseline  WBAT with bil crutches    Status  On-going        PT Long Term Goals - 03/01/19 1211      PT LONG TERM GOAL #1   Title  be independent in advanced HEP    Time  12    Period  Weeks    Status  New    Target Date  04/26/19      PT LONG TERM GOAL #2   Title  reduce FOTO to < or 34% limitation    Time  12    Period  Weeks    Status  New    Target Date  05/24/19      PT LONG TERM GOAL #3   Title  demonstrate 4 to 4+/5 Rt ankle strength to improve stability with gait on unlevel surfaces    Time  12    Period  Weeks    Status  New    Target Date  05/24/19      PT LONG TERM GOAL #4   Title  demonstrate Rt ankle A/ROM DF to 8 degrees to normalize gait    Time  12    Period  Weeks    Status  New    Target Date   05/24/19      PT LONG TERM GOAL #5   Title  ambulate on level surfaces without boot and demonstrate symmetry    Time  12    Period  Weeks    Status  New    Target Date  05/24/19      PT LONG TERM GOAL #6   Title  improve strength and endurance to stand and walk for 1 hour to allow for return to independence at home and in the community    Time  12    Period  Weeks    Status  New    Target Date  05/24/19            Plan - 04/05/19 1619    Clinical Impression Statement  Pt has been working on weightbearing into the Rt LE when out of the boot.  Pt reports minimal weightbearing on the Rt due to fear of instability.  Pt is challenged with heel strike on the Rt due to limited available DF motion.  Pt was able to perform step-up on the Rt LE with minimal UE support.  Pt required cueing for active DF to reduce foot sliding across the top of the step when stepping down.  Pt was able to ambulate with 2 crutches with step-through gait on level surface after gait training.  Pt advanced to red theraband for ankle DF and will continue to work on this.  Pt will continue to benefit from skilled PT for Rt ankle strength, gait, DF strength and A/ROM and P/ROM.    PT Frequency  2x / week    PT Duration  12 weeks    PT Treatment/Interventions  ADLs/Self Care Home Management;Cryotherapy;Electrical Stimulation;Gait training;Moist Heat;Functional mobility training;Therapeutic activities;Therapeutic exercise;Neuromuscular re-education;Patient/family education;Manual techniques;Scar mobilization;Passive range of motion;Taping;Vasopneumatic Device  PT Next Visit Plan  work on Rt ankle A/ROM and P/ROM, work on heel strike with gait out of boot    PT Home Exercise Plan  Access Code: [email or web address]    Consulted and Agree with Plan of Care  Patient       Patient will benefit from skilled therapeutic intervention in order to improve the following deficits and impairments:  Abnormal gait, Decreased activity tolerance,  Decreased mobility, Decreased endurance, Difficulty walking, Decreased scar mobility, Decreased strength, Increased edema, Increased muscle spasms, Impaired flexibility, Pain  Visit Diagnosis: Stiffness of right ankle, not elsewhere classified  Other abnormalities of gait and mobility  Muscle weakness (generalized)  Localized edema  Pain in right ankle and joints of right foot     Problem List Patient Active Problem List   Diagnosis Date Noted  . Bimalleolar fracture of right ankle 01/18/2019  . Closed right ankle fracture 01/18/2019  . Fibroid 09/23/2011  . Short stature 09/23/2011    Sigurd Sos, PT 04/05/19 4:20 PM  [facility] [address], [place], [place], [address] Phone: [phone]   Fax:  [phone]  Name: Rita Long MRN: [id number] Date of Birth: January 31, 1991

## 2019-04-06 ENCOUNTER — Encounter

## 2019-04-07 ENCOUNTER — Ambulatory Visit: Payer: BLUE CROSS/BLUE SHIELD | Admitting: Physical Therapy

## 2019-04-07 ENCOUNTER — Encounter: Payer: Self-pay | Admitting: Physical Therapy

## 2019-04-07 ENCOUNTER — Other Ambulatory Visit: Payer: Self-pay

## 2019-04-07 DIAGNOSIS — M6281 Muscle weakness (generalized): Secondary | ICD-10-CM

## 2019-04-07 DIAGNOSIS — M25571 Pain in right ankle and joints of right foot: Secondary | ICD-10-CM

## 2019-04-07 DIAGNOSIS — M25671 Stiffness of right ankle, not elsewhere classified: Secondary | ICD-10-CM | POA: Diagnosis not present

## 2019-04-07 DIAGNOSIS — R2689 Other abnormalities of gait and mobility: Secondary | ICD-10-CM

## 2019-04-07 DIAGNOSIS — R600 Localized edema: Secondary | ICD-10-CM

## 2019-04-07 NOTE — Telephone Encounter (Signed)
Must continue per plan that was given to her last office visit with Dr Lorin Mercy.

## 2019-04-07 NOTE — Telephone Encounter (Signed)
Continue boot and wbat tolerated with crutches until ROV with [facility].

## 2019-04-07 NOTE — Therapy (Signed)
[facility] [address], [place], [place], [address] Phone: [phone]   Fax:  [phone]  Physical Therapy Treatment  Patient Details  Name: Rita Long MRN: [id number] Date of Birth: Mar 21, 1991 Referring Provider (PT): Rodell Perna, MD   Encounter Date: 04/07/2019  PT End of Session - 04/07/19 0932    Visit Number  11    Date for PT Re-Evaluation  05/24/19    Authorization Type  BCBS    PT Start Time  0932    PT Stop Time  1030    PT Time Calculation (min)  58 min    Activity Tolerance  Patient tolerated treatment well    Behavior During Therapy  [facility] for tasks assessed/performed       Past Medical History:  Diagnosis Date  . Fibroid    "unsure"    Past Surgical History:  Procedure Laterality Date  . CESAREAN SECTION  07/01/2011   Procedure: CESAREAN SECTION;  Surgeon: Alwyn Pea, MD;  Location: [facility];  Service: Gynecology;  Laterality: N/A;  . ORIF ANKLE FRACTURE Right 01/18/2019   Procedure: OPEN REDUCTION INTERNAL FIXATION (ORIF) RIGHT ANKLE( BI-MALEOLAR-POSTERIOR AND LATERAL);  Surgeon: Marybelle Killings, MD;  Location: [facility];  Service: Orthopedics;  Laterality: Right;  POPLITEAL BLOCK  . WISDOM TOOTH EXTRACTION  2015    There were no vitals filed for this visit.  Subjective Assessment - 04/07/19 0935    Subjective  No pain or soreness today.    Pertinent History  none    Limitations  Standing;Walking    Currently in Pain?  No/denies    Multiple Pain Sites  No         [facility] PT Assessment - 04/07/19 0001      AROM   Right Ankle Dorsiflexion  --   -1                  [facility] Adult PT Treatment/Exercise - 04/07/19 0001      Ankle Exercises: Aerobic   Stationary Bike  L2 x 7 min: VC to put wt into balls of the feet on the pedal and maintain > 50 RPMS      Ankle Exercises: Standing   Rebounder  weight shifting 3 ways on mimi trap 1 min each       Ankle Exercises:  Stretches   Slant Board Stretch  4 reps;60 seconds   VC to keep knee soft     Ankle Exercises: Machines for Strengthening   Cybex Leg Press  Seat 3 Bil 60# 2x15       Ankle Exercises: Seated   Heel Slides  --   4# LAQ 20x   Other Seated Ankle Exercises  red band dorsiflexion 3x15   10x Eversion     Ankle Exercises: Sidelying   Other Sidelying Ankle Exercises  2# hip abduction 2x10                PT Short Term Goals - 03/29/19 1419      PT SHORT TERM GOAL #4   Title  ambulate on level surface with 1 crutch an full weightbearing on Rt LE    Baseline  WBAT with bil crutches    Status  On-going        PT Long Term Goals - 03/01/19 1211      PT LONG TERM GOAL #1   Title  be independent in advanced HEP    Time  12  Period  Weeks    Status  New    Target Date  04/26/19      PT LONG TERM GOAL #2   Title  reduce FOTO to < or 34% limitation    Time  12    Period  Weeks    Status  New    Target Date  05/24/19      PT LONG TERM GOAL #3   Title  demonstrate 4 to 4+/5 Rt ankle strength to improve stability with gait on unlevel surfaces    Time  12    Period  Weeks    Status  New    Target Date  05/24/19      PT LONG TERM GOAL #4   Title  demonstrate Rt ankle A/ROM DF to 8 degrees to normalize gait    Time  12    Period  Weeks    Status  New    Target Date  05/24/19      PT LONG TERM GOAL #5   Title  ambulate on level surfaces without boot and demonstrate symmetry    Time  12    Period  Weeks    Status  New    Target Date  05/24/19      PT LONG TERM GOAL #6   Title  improve strength and endurance to stand and walk for 1 hour to allow for return to independence at home and in the community    Time  12    Period  Weeks    Status  New    Target Date  05/24/19            Plan - 04/07/19 0934    Clinical Impression Statement  Pt arrives pain free/absent of soreness. She will see her MD 04/21/19. She was able to participate in weightbearing  exercises using her axillary crutches for WB adherance. Added RT hip quad and hip strength today Pt does require verbal cues to avoid hyperextending her knees.Pt active dorsiflexion improved to almost neutral; -1.    Examination-Activity Limitations  Locomotion Level;Transfers;Stand;Stairs;Squat    Examination-Participation Restrictions  Meal Prep    Stability/Clinical Decision Making  Stable/Uncomplicated    Rehab Potential  Excellent    PT Frequency  2x / week    PT Duration  12 weeks    PT Treatment/Interventions  ADLs/Self Care Home Management;Cryotherapy;Electrical Stimulation;Gait training;Moist Heat;Functional mobility training;Therapeutic activities;Therapeutic exercise;Neuromuscular re-education;Patient/family education;Manual techniques;Scar mobilization;Passive range of motion;Taping;Vasopneumatic Device    PT Next Visit Plan  work on Rt ankle A/ROM and P/ROM, work on heel strike with gait out of boot and strength of dorsiflexion.    PT Home Exercise Plan  Access Code: [email or web address] and Agree with Plan of Care  Patient       Patient will benefit from skilled therapeutic intervention in order to improve the following deficits and impairments:  Abnormal gait, Decreased activity tolerance, Decreased mobility, Decreased endurance, Difficulty walking, Decreased scar mobility, Decreased strength, Increased edema, Increased muscle spasms, Impaired flexibility, Pain  Visit Diagnosis: Stiffness of right ankle, not elsewhere classified  Other abnormalities of gait and mobility  Muscle weakness (generalized)  Localized edema  Pain in right ankle and joints of right foot     Problem List Patient Active Problem List   Diagnosis Date Noted  . Bimalleolar fracture of right ankle 01/18/2019  . Closed right ankle fracture 01/18/2019  . Fibroid 09/23/2011  . Short stature 09/23/2011    ,, PTA  04/07/2019, 10:15 AM  [facility] [address], [place], [place], [address] Phone: [phone]   Fax:  [phone]  Name: Rita Long MRN: [id number] Date of Birth: 04/07/1991

## 2019-04-12 ENCOUNTER — Other Ambulatory Visit: Payer: Self-pay

## 2019-04-12 ENCOUNTER — Ambulatory Visit: Payer: BLUE CROSS/BLUE SHIELD | Admitting: Physical Therapy

## 2019-04-12 ENCOUNTER — Encounter

## 2019-04-12 ENCOUNTER — Encounter: Payer: Self-pay | Admitting: Physical Therapy

## 2019-04-12 DIAGNOSIS — M25571 Pain in right ankle and joints of right foot: Secondary | ICD-10-CM

## 2019-04-12 DIAGNOSIS — R600 Localized edema: Secondary | ICD-10-CM

## 2019-04-12 DIAGNOSIS — M6281 Muscle weakness (generalized): Secondary | ICD-10-CM

## 2019-04-12 DIAGNOSIS — R2689 Other abnormalities of gait and mobility: Secondary | ICD-10-CM

## 2019-04-12 DIAGNOSIS — M25671 Stiffness of right ankle, not elsewhere classified: Secondary | ICD-10-CM | POA: Diagnosis not present

## 2019-04-12 NOTE — Therapy (Signed)
[facility] [address] [place], [place], [address] Phone: [phone]   Fax:  [phone]  Physical Therapy Treatment  Patient Details  Name: Cove Igleheart MRN: [id number] Date of Birth: [birth year] Referring Provider (PT): Rodell Perna, MD   Encounter Date: 04/12/2019  PT End of Session - 04/12/19 1617    Visit Number  12    Date for PT Re-Evaluation  05/24/19    Authorization Type  BCBS    PT Start Time  1612    PT Stop Time  1705    PT Time Calculation (min)  53 min    Activity Tolerance  Patient tolerated treatment well    Behavior During Therapy  [facility] for tasks assessed/performed       Past Medical History:  Diagnosis Date  . Fibroid    "unsure"    Past Surgical History:  Procedure Laterality Date  . CESAREAN SECTION  07/01/2011   Procedure: CESAREAN SECTION;  Surgeon: Alwyn Pea, MD;  Location: [facility];  Service: Gynecology;  Laterality: N/A;  . ORIF ANKLE FRACTURE Right 01/18/2019   Procedure: OPEN REDUCTION INTERNAL FIXATION (ORIF) RIGHT ANKLE( BI-MALEOLAR-POSTERIOR AND LATERAL);  Surgeon: Marybelle Killings, MD;  Location: [facility];  Service: Orthopedics;  Laterality: Right;  POPLITEAL BLOCK  . WISDOM TOOTH EXTRACTION  2015    There were no vitals filed for this visit.  Subjective Assessment - 04/12/19 1619    Subjective  No pain or soreness today.    Pertinent History  none    Currently in Pain?  No/denies    Multiple Pain Sites  No         [facility] PT Assessment - 04/12/19 0001      AROM   Right Ankle Dorsiflexion  0                   [facility] Adult PT Treatment/Exercise - 04/12/19 0001      Vasopneumatic   Number Minutes Vasopneumatic   10 minutes    Vasopnuematic Location   Ankle    Vasopneumatic Pressure  Medium    Vasopneumatic Temperature   3 snowflakes      Manual Therapy   Passive ROM  Rt ankle DF, PF, inversion, Rt great toe extension/flexion supine and prone        Ankle Exercises: Aerobic   Stationary Bike  L2 x 7 min: VC to put wt into balls of the feet on the pedal and maintain > 50 RPMS      Ankle Exercises: Standing   Rebounder  weight shifting 3 ways on mimi trap 1 min each       Ankle Exercises: Stretches   Slant Board Stretch  4 reps;60 seconds   VC to keep knee soft     Ankle Exercises: Machines for Strengthening   Cybex Leg Press  Seat 3 Bil 65# 3x10       Ankle Exercises: Seated   Heel Slides  --   5# LAQ 2x15   Other Seated Ankle Exercises  red band dorsiflexion 3x15   10x Eversion     Ankle Exercises: Sidelying   Other Sidelying Ankle Exercises  3# hip abduction 2x 10 TC to keep pelvis from elevating                PT Short Term Goals - 03/29/19 1419      PT SHORT TERM GOAL #4   Title  ambulate on level surface with 1  crutch an full weightbearing on Rt LE    Baseline  WBAT with bil crutches    Status  On-going        PT Long Term Goals - 03/01/19 1211      PT LONG TERM GOAL #1   Title  be independent in advanced HEP    Time  12    Period  Weeks    Status  New    Target Date  04/26/19      PT LONG TERM GOAL #2   Title  reduce FOTO to < or 34% limitation    Time  12    Period  Weeks    Status  New    Target Date  05/24/19      PT LONG TERM GOAL #3   Title  demonstrate 4 to 4+/5 Rt ankle strength to improve stability with gait on unlevel surfaces    Time  12    Period  Weeks    Status  New    Target Date  05/24/19      PT LONG TERM GOAL #4   Title  demonstrate Rt ankle A/ROM DF to 8 degrees to normalize gait    Time  12    Period  Weeks    Status  New    Target Date  05/24/19      PT LONG TERM GOAL #5   Title  ambulate on level surfaces without boot and demonstrate symmetry    Time  12    Period  Weeks    Status  New    Target Date  05/24/19      PT LONG TERM GOAL #6   Title  improve strength and endurance to stand and walk for 1 hour to allow for return to independence at home and in  the community    Time  12    Period  Weeks    Status  New    Target Date  05/24/19            Plan - 04/12/19 1617    Clinical Impression Statement  Pt arrives pain free. She is adhearing to her wt bearing status with her CAM boot. Still has tightness in her gastroc/soleus but improved since day one. Active dorsiflexion today is to neutral.    Examination-Activity Limitations  Locomotion Level;Transfers;Stand;Stairs;Squat    Examination-Participation Restrictions  Meal Prep    Stability/Clinical Decision Making  Stable/Uncomplicated    Rehab Potential  Excellent    PT Frequency  2x / week    PT Duration  12 weeks    PT Treatment/Interventions  ADLs/Self Care Home Management;Cryotherapy;Electrical Stimulation;Gait training;Moist Heat;Functional mobility training;Therapeutic activities;Therapeutic exercise;Neuromuscular re-education;Patient/family education;Manual techniques;Scar mobilization;Passive range of motion;Taping;Vasopneumatic Device    PT Next Visit Plan  work on Rt ankle A/ROM and P/ROM, work on heel strike with gait out of boot and strength of dorsiflexion.    PT Home Exercise Plan  Access Code: [email or web address] and Agree with Plan of Care  Patient       Patient will benefit from skilled therapeutic intervention in order to improve the following deficits and impairments:  Abnormal gait, Decreased activity tolerance, Decreased mobility, Decreased endurance, Difficulty walking, Decreased scar mobility, Decreased strength, Increased edema, Increased muscle spasms, Impaired flexibility, Pain  Visit Diagnosis: Stiffness of right ankle, not elsewhere classified  Other abnormalities of gait and mobility  Muscle weakness (generalized)  Localized edema  Pain in right ankle and joints of right  foot     Problem List Patient Active Problem List   Diagnosis Date Noted  . Bimalleolar fracture of right ankle 01/18/2019  . Closed right ankle fracture 01/18/2019  .  Fibroid 09/23/2011  . Short stature 09/23/2011    Adalay Azucena, PTA 04/12/2019, 4:48 PM  [facility] [address], [place], [place], [address] Phone: [phone]   Fax:  [phone]  Name: Harlynn Audas MRN: [id number] Date of Birth: November 30, 1990

## 2019-04-14 ENCOUNTER — Ambulatory Visit: Payer: BLUE CROSS/BLUE SHIELD | Admitting: Physical Therapy

## 2019-04-14 ENCOUNTER — Encounter: Payer: Self-pay | Admitting: Physical Therapy

## 2019-04-14 ENCOUNTER — Other Ambulatory Visit: Payer: Self-pay

## 2019-04-14 DIAGNOSIS — M6281 Muscle weakness (generalized): Secondary | ICD-10-CM

## 2019-04-14 DIAGNOSIS — R2689 Other abnormalities of gait and mobility: Secondary | ICD-10-CM

## 2019-04-14 DIAGNOSIS — M25571 Pain in right ankle and joints of right foot: Secondary | ICD-10-CM

## 2019-04-14 DIAGNOSIS — M25671 Stiffness of right ankle, not elsewhere classified: Secondary | ICD-10-CM | POA: Diagnosis not present

## 2019-04-14 DIAGNOSIS — R600 Localized edema: Secondary | ICD-10-CM

## 2019-04-14 NOTE — Therapy (Signed)
[facility] [address] [place], [place], [address] Phone: [phone]   Fax:  [phone]  Physical Therapy Treatment  Patient Details  Name: Rita Long MRN: [id number] Date of Birth: May 13, 1991 Referring Provider (PT): Rodell Perna, MD   Encounter Date: 04/14/2019  PT End of Session - 04/14/19 0934    Visit Number  13    Date for PT Re-Evaluation  05/24/19    Authorization Type  BCBS    PT Start Time  0932    PT Stop Time  1035    PT Time Calculation (min)  63 min    Equipment Utilized During Treatment  Other (comment)   CAM boot   Activity Tolerance  Patient tolerated treatment well    Behavior During Therapy  [facility] for tasks assessed/performed       Past Medical History:  Diagnosis Date  . Fibroid    "unsure"    Past Surgical History:  Procedure Laterality Date  . CESAREAN SECTION  07/01/2011   Procedure: CESAREAN SECTION;  Surgeon: Alwyn Pea, MD;  Location: [facility];  Service: Gynecology;  Laterality: N/A;  . ORIF ANKLE FRACTURE Right 01/18/2019   Procedure: OPEN REDUCTION INTERNAL FIXATION (ORIF) RIGHT ANKLE( BI-MALEOLAR-POSTERIOR AND LATERAL);  Surgeon: Marybelle Killings, MD;  Location: [facility];  Service: Orthopedics;  Laterality: Right;  POPLITEAL BLOCK  . WISDOM TOOTH EXTRACTION  2015    There were no vitals filed for this visit.  Subjective Assessment - 04/14/19 0936    Subjective  No new complaints, no pain.    Pertinent History  none    Limitations  Standing;Walking    Currently in Pain?  No/denies    Multiple Pain Sites  No                       [facility] Adult PT Treatment/Exercise - 04/14/19 0001      Vasopneumatic   Number Minutes Vasopneumatic   10 minutes    Vasopnuematic Location   Ankle    Vasopneumatic Pressure  Medium    Vasopneumatic Temperature   3 snowflakes      Manual Therapy   Joint Mobilization  Gd 3 mobs for dorsiflexion 4 bouts 20 sec     Soft  tissue mobilization  Scar mobs    Passive ROM  Rt ankle DF, PF, inversion, Rt great toe extension/flexion supine and prone       Ankle Exercises: Aerobic   Stationary Bike  L2 x 8 min: VC to put wt into balls of the feet on the pedal and maintain > 50 RPMS      Ankle Exercises: Standing   Rebounder  weight shifting 3 ways on mimi trap 1 min each     Heel Raises  Both;20 reps   2x 10     Ankle Exercises: Stretches   Slant Board Stretch  4 reps;60 seconds   VC to keep knee soft     Ankle Exercises: Machines for Strengthening   Cybex Leg Press  Seat 3 Bil 70# 3x10, RTLE 30# 15x, calf press bil 30# 15x      Ankle Exercises: Seated   Heel Slides  --   6# LAQ 2x15   Other Seated Ankle Exercises  red band dorsiflexion 3x15   10x Eversion     Ankle Exercises: Sidelying   Other Sidelying Ankle Exercises  3# hip abduction 3x10 TC to keep pelvis from elevating    TC  for correct LE alignment              PT Short Term Goals - 03/29/19 1419      PT SHORT TERM GOAL #4   Title  ambulate on level surface with 1 crutch an full weightbearing on Rt LE    Baseline  WBAT with bil crutches    Status  On-going        PT Long Term Goals - 03/01/19 1211      PT LONG TERM GOAL #1   Title  be independent in advanced HEP    Time  12    Period  Weeks    Status  New    Target Date  04/26/19      PT LONG TERM GOAL #2   Title  reduce FOTO to < or 34% limitation    Time  12    Period  Weeks    Status  New    Target Date  05/24/19      PT LONG TERM GOAL #3   Title  demonstrate 4 to 4+/5 Rt ankle strength to improve stability with gait on unlevel surfaces    Time  12    Period  Weeks    Status  New    Target Date  05/24/19      PT LONG TERM GOAL #4   Title  demonstrate Rt ankle A/ROM DF to 8 degrees to normalize gait    Time  12    Period  Weeks    Status  New    Target Date  05/24/19      PT LONG TERM GOAL #5   Title  ambulate on level surfaces without boot and  demonstrate symmetry    Time  12    Period  Weeks    Status  New    Target Date  05/24/19      PT LONG TERM GOAL #6   Title  improve strength and endurance to stand and walk for 1 hour to allow for return to independence at home and in the community    Time  12    Period  Weeks    Status  New    Target Date  05/24/19            Plan - 04/14/19 0935    Clinical Impression Statement  No pain complaints. Pt was able to tolerate an increase in her resistance for both LE ( from disuse) and her ankles. Pt had much difficulty with the calf press due to mainly weakness and limited AROM.    Examination-Activity Limitations  Locomotion Level;Transfers;Stand;Stairs;Squat    Examination-Participation Restrictions  Meal Prep    Stability/Clinical Decision Making  Stable/Uncomplicated    Rehab Potential  Excellent    PT Frequency  2x / week    PT Duration  12 weeks    PT Treatment/Interventions  ADLs/Self Care Home Management;Cryotherapy;Electrical Stimulation;Gait training;Moist Heat;Functional mobility training;Therapeutic activities;Therapeutic exercise;Neuromuscular re-education;Patient/family education;Manual techniques;Scar mobilization;Passive range of motion;Taping;Vasopneumatic Device    PT Next Visit Plan  work on Rt ankle A/ROM and P/ROM, work on heel strike with gait out of boot and strength of dorsiflexion.    PT Home Exercise Plan  Access Code: [email or web address] and Agree with Plan of Care  Patient       Patient will benefit from skilled therapeutic intervention in order to improve the following deficits and impairments:  Abnormal gait, Decreased activity tolerance, Decreased mobility, Decreased endurance,  Difficulty walking, Decreased scar mobility, Decreased strength, Increased edema, Increased muscle spasms, Impaired flexibility, Pain  Visit Diagnosis: Stiffness of right ankle, not elsewhere classified  Other abnormalities of gait and mobility  Muscle weakness  (generalized)  Localized edema  Pain in right ankle and joints of right foot     Problem List Patient Active Problem List   Diagnosis Date Noted  . Bimalleolar fracture of right ankle 01/18/2019  . Closed right ankle fracture 01/18/2019  . Fibroid 09/23/2011  . Short stature 09/23/2011    Rita Long, PTA 04/14/2019, 4:27 PM  [facility] [address], [place], [place], [address] Phone: [phone]   Fax:  [phone]  Name: Rita Long MRN: [id number] Date of Birth: January 10, 1991

## 2019-04-18 ENCOUNTER — Ambulatory Visit: Payer: BLUE CROSS/BLUE SHIELD | Admitting: Orthopaedic Surgery

## 2019-04-20 ENCOUNTER — Other Ambulatory Visit: Payer: Self-pay

## 2019-04-20 ENCOUNTER — Ambulatory Visit: Payer: BLUE CROSS/BLUE SHIELD | Attending: Orthopaedic Surgery | Admitting: Physical Therapy

## 2019-04-20 ENCOUNTER — Encounter: Payer: Self-pay | Admitting: Physical Therapy

## 2019-04-20 DIAGNOSIS — M25571 Pain in right ankle and joints of right foot: Secondary | ICD-10-CM | POA: Insufficient documentation

## 2019-04-20 DIAGNOSIS — R600 Localized edema: Secondary | ICD-10-CM | POA: Diagnosis present

## 2019-04-20 DIAGNOSIS — M25671 Stiffness of right ankle, not elsewhere classified: Secondary | ICD-10-CM

## 2019-04-20 DIAGNOSIS — R2689 Other abnormalities of gait and mobility: Secondary | ICD-10-CM | POA: Diagnosis present

## 2019-04-20 DIAGNOSIS — M6281 Muscle weakness (generalized): Secondary | ICD-10-CM | POA: Diagnosis present

## 2019-04-20 NOTE — Therapy (Signed)
[facility] [address], [place], [place], [address] Phone: [phone]   Fax:  [phone]  Physical Therapy Treatment  Patient Details  Name: Rita Long MRN: [id number] Date of Birth: 1990/10/21 Referring Provider (PT): Rodell Perna, MD   Encounter Date: 04/20/2019  PT End of Session - 04/20/19 0956    Visit Number  14    Date for PT Re-Evaluation  05/24/19    Authorization Type  BCBS    PT Start Time  0847    PT Stop Time  0940    PT Time Calculation (min)  53 min    Equipment Utilized During Treatment  Other (comment)   CAM boot   Activity Tolerance  Patient tolerated treatment well    Behavior During Therapy  [facility] for tasks assessed/performed       Past Medical History:  Diagnosis Date  . Fibroid    "unsure"    Past Surgical History:  Procedure Laterality Date  . CESAREAN SECTION  07/01/2011   Procedure: CESAREAN SECTION;  Surgeon: Alwyn Pea, MD;  Location: [facility];  Service: Gynecology;  Laterality: N/A;  . ORIF ANKLE FRACTURE Right 01/18/2019   Procedure: OPEN REDUCTION INTERNAL FIXATION (ORIF) RIGHT ANKLE( BI-MALEOLAR-POSTERIOR AND LATERAL);  Surgeon: Marybelle Killings, MD;  Location: [facility];  Service: Orthopedics;  Laterality: Right;  POPLITEAL BLOCK  . WISDOM TOOTH EXTRACTION  2015    There were no vitals filed for this visit.  Subjective Assessment - 04/20/19 0850    Subjective  Pt states that her ankle is a little sore today. No other complaints.    Pertinent History  none    Limitations  Standing;Walking    Currently in Pain?  Yes    Pain Score  3     Pain Location  Ankle    Pain Orientation  Right;Medial    Pain Descriptors / Indicators  Aching;Dull    Pain Type  Other (Comment)    Pain Radiating Towards  none    Pain Onset  1 to 4 weeks ago    Pain Frequency  Occasional    Aggravating Factors   sore to touch    Pain Relieving Factors  pain is intermittent                        [facility] Adult PT Treatment/Exercise - 04/20/19 0001      Ambulation/Gait   Gait Comments  6" Lt step tap x15 reps, No UE support; Lt step to over 3 consecutive hurdles Lt axillary crutch x6 trials      Knee/Hip Exercises: Aerobic   Recumbent Bike  L2 x12min      Vasopneumatic   Number Minutes Vasopneumatic   10 minutes    Vasopnuematic Location   Ankle    Vasopneumatic Pressure  Medium    Vasopneumatic Temperature   3 snowflakes      Manual Therapy   Joint Mobilization  Rt grade III-IV medial and lateral subtalar joint mobilization x2 bouts, Rt grade III-IV 1st MTP joint mobilization AP and PA directions; Rt talocrural closed chain DF MWM x10 reps (pain free)     Passive ROM  Rt great toe flexion/extension stretch      Ankle Exercises: Seated   Other Seated Ankle Exercises  Rt great toe extension 10x5 sec; Rt arch squeeze 10x5 sec     Ankle Exercises: Standing   Other Standing Ankle Exercises  closed chain DF on  Rt attmpting to bring knee to foam roll 15x5 sec       Ankle Exercises: Aerobic   Stationary Bike  L2 x6 min, PT present to discuss progress             PT Education - 04/20/19 0957    Education Details  technique with therex/activity    Person(s) Educated  Patient    Methods  Explanation;Verbal cues    Comprehension  Verbalized understanding;Returned demonstration       PT Short Term Goals - 03/29/19 1419      PT SHORT TERM GOAL #4   Title  ambulate on level surface with 1 crutch an full weightbearing on Rt LE    Baseline  WBAT with bil crutches    Status  On-going        PT Long Term Goals - 03/01/19 1211      PT LONG TERM GOAL #1   Title  be independent in advanced HEP    Time  12    Period  Weeks    Status  New    Target Date  04/26/19      PT LONG TERM GOAL #2   Title  reduce FOTO to < or 34% limitation    Time  12    Period  Weeks    Status  New    Target Date  05/24/19      PT LONG TERM GOAL #3    Title  demonstrate 4 to 4+/5 Rt ankle strength to improve stability with gait on unlevel surfaces    Time  12    Period  Weeks    Status  New    Target Date  05/24/19      PT LONG TERM GOAL #4   Title  demonstrate Rt ankle A/ROM DF to 8 degrees to normalize gait    Time  12    Period  Weeks    Status  New    Target Date  05/24/19      PT LONG TERM GOAL #5   Title  ambulate on level surfaces without boot and demonstrate symmetry    Time  12    Period  Weeks    Status  New    Target Date  05/24/19      PT LONG TERM GOAL #6   Title  improve strength and endurance to stand and walk for 1 hour to allow for return to independence at home and in the community    Time  12    Period  Weeks    Status  New    Target Date  05/24/19            Plan - 04/20/19 0931    Clinical Impression Statement  Pt arrived with mild medial ankle discomfort. She has excessive pronation during ambulation, likely also contributing to the strain on the tendons along the medial aspect of the ankle with recent increase in gait training. Although improving overall, pt continues to have restrictions in great toe flexibility and ankle open/closed chain dorsiflexion. Therapist completed joint mobilization and passive stretching to address these limitations, with noted increase in closed chain ankle dorsiflexion following today's session. Pt struggled with intrinsic foot muscle activation initially, but this improved with repetition. She would continue to benefit from skilled PT moving forward in order to promote pain free, independent gait.    Examination-Activity Limitations  Locomotion Level;Transfers;Stand;Stairs;Squat    Examination-Participation Restrictions  Meal Prep  Stability/Clinical Decision Making  Stable/Uncomplicated    Rehab Potential  Excellent    PT Frequency  2x / week    PT Duration  12 weeks    PT Treatment/Interventions  ADLs/Self Care Home Management;Cryotherapy;Electrical  Stimulation;Gait training;Moist Heat;Functional mobility training;Therapeutic activities;Therapeutic exercise;Neuromuscular re-education;Patient/family education;Manual techniques;Scar mobilization;Passive range of motion;Taping;Vasopneumatic Device    PT Next Visit Plan  ROM measurement foot/ankle for MD; work on Rt ankle A/ROM and P/ROM, great toe flexion/extension; continue with gait training and instrinsic foot muscle strength    PT Home Exercise Plan  Access Code: [email or web address]    Consulted and Agree with Plan of Care  Patient       Patient will benefit from skilled therapeutic intervention in order to improve the following deficits and impairments:  Abnormal gait, Decreased activity tolerance, Decreased mobility, Decreased endurance, Difficulty walking, Decreased scar mobility, Decreased strength, Increased edema, Increased muscle spasms, Impaired flexibility, Pain  Visit Diagnosis: Stiffness of right ankle, not elsewhere classified  Other abnormalities of gait and mobility  Muscle weakness (generalized)  Localized edema  Pain in right ankle and joints of right foot     Problem List Patient Active Problem List   Diagnosis Date Noted  . Bimalleolar fracture of right ankle 01/18/2019  . Closed right ankle fracture 01/18/2019  . Fibroid 09/23/2011  . Short stature 09/23/2011    9:59 AM,04/20/19 Sherol Dade PT, DPT [facility] [address], [place], [place], [address] Phone: [phone]   Fax:  [phone]  Name: Heloise Schoenwetter MRN: [id number] Date of Birth: [birth year]

## 2019-04-21 ENCOUNTER — Encounter: Payer: Self-pay | Admitting: Physical Therapy

## 2019-04-21 ENCOUNTER — Ambulatory Visit: Payer: BLUE CROSS/BLUE SHIELD | Admitting: Physical Therapy

## 2019-04-21 ENCOUNTER — Other Ambulatory Visit: Payer: Self-pay

## 2019-04-21 ENCOUNTER — Ambulatory Visit (INDEPENDENT_AMBULATORY_CARE_PROVIDER_SITE_OTHER): Payer: BLUE CROSS/BLUE SHIELD | Admitting: Orthopaedic Surgery

## 2019-04-21 ENCOUNTER — Encounter: Payer: Self-pay | Admitting: Orthopaedic Surgery

## 2019-04-21 VITALS — BP 130/85 | HR 84 | Ht 60.0 in | Wt 160.0 lb

## 2019-04-21 DIAGNOSIS — M25671 Stiffness of right ankle, not elsewhere classified: Secondary | ICD-10-CM

## 2019-04-21 DIAGNOSIS — S82841D Displaced bimalleolar fracture of right lower leg, subsequent encounter for closed fracture with routine healing: Secondary | ICD-10-CM | POA: Diagnosis not present

## 2019-04-21 DIAGNOSIS — R2689 Other abnormalities of gait and mobility: Secondary | ICD-10-CM

## 2019-04-21 DIAGNOSIS — R600 Localized edema: Secondary | ICD-10-CM

## 2019-04-21 DIAGNOSIS — M6281 Muscle weakness (generalized): Secondary | ICD-10-CM

## 2019-04-21 DIAGNOSIS — M25571 Pain in right ankle and joints of right foot: Secondary | ICD-10-CM

## 2019-04-21 NOTE — Progress Notes (Signed)
Office Visit Note   Patient: Rita Long           Date of Birth: 11-16-1990           MRN: [id number] Visit Date: 04/21/2019              Requested by: Delsa Bern, MD [address] [place],  [place] [address] PCP: Delsa Bern, MD   Assessment & Plan: Visit Diagnoses:  1. Closed bimalleolar fracture of right ankle with routine healing, subsequent encounter     Plan: She now can use her tennis shoe work on regular ambulation continue strengthening and work her way back into the gym.  She is happy with the results of treatment and will check her back again on a as needed basis.  When she finishes her last therapy visit she can then transition to the gym .   Follow-Up Instructions: Return if symptoms worsen or fail to improve.   Orders:  No orders of the defined types were placed in this encounter.  No orders of the defined types were placed in this encounter.     Procedures: No procedures performed   Clinical Data: No additional findings.   Subjective: Chief Complaint  Patient presents with  . Right Ankle - Follow-up    01/18/2019 ORIF Right Bimalleolar Fracture    HPI 29 year old female returns follow-up ORIF right bimalleolar ankle fracture more than 3 months ago.  She is in a walking with her cam boot using a cane and can walk with her cam boot alone.  She states her pain is maybe 2 out of 10.  She is going to physical therapy for heel cord stretching and gait work as well as Hotel manager.  She states she is moved back to her own apartment out of her parents house where she stayed in the postoperative time..  She is not on pain medication currently.  Review of Systems reviewed updated and unchanged.  Objective: Vital Signs: BP 130/85   Pulse 84   Ht 5' (1.524 m)   Wt 160 lb (72.6 kg)   BMI 31.25 kg/m   Physical Exam Constitutional:      Appearance: She is well-developed.  HENT:     Head: Normocephalic.     Right Ear: External ear  normal.     Left Ear: External ear normal.  Eyes:     Pupils: Pupils are equal, round, and reactive to light.  Neck:     Thyroid: No thyromegaly.     Trachea: No tracheal deviation.  Cardiovascular:     Rate and Rhythm: Normal rate.  Pulmonary:     Effort: Pulmonary effort is normal.  Abdominal:     Palpations: Abdomen is soft.  Skin:    General: Skin is warm and dry.  Neurological:     Mental Status: She is alert and oriented to person, place, and time.  Psychiatric:        Behavior: Behavior normal.     Ortho Exam well-healed right lateral ankle incision as well as anterior small incision for leg and posterior malleolus.  No tenderness medially.  Slight keloid formation to the incision skin is well-healed.  She is able to ambulate in the office without her boot.  No tenderness medially over the deltoid.  Pulses normal sensation the foot is intact.  Specialty Comments:  No specialty comments available.  Imaging: No results found.   PMFS History: Patient Active Problem List   Diagnosis Date Noted  .  Bimalleolar fracture of right ankle 01/18/2019  . Closed right ankle fracture 01/18/2019  . Fibroid 09/23/2011  . Short stature 09/23/2011   Past Medical History:  Diagnosis Date  . Fibroid    "unsure"    Family History  Problem Relation Age of Onset  . Hypertension Mother   . Anesthesia problems Neg Hx   . Hypotension Neg Hx   . Malignant hyperthermia Neg Hx   . Pseudochol deficiency Neg Hx     Past Surgical History:  Procedure Laterality Date  . CESAREAN SECTION  07/01/2011   Procedure: CESAREAN SECTION;  Surgeon: Alwyn Pea, MD;  Location: [facility];  Service: Gynecology;  Laterality: N/A;  . ORIF ANKLE FRACTURE Right 01/18/2019   Procedure: OPEN REDUCTION INTERNAL FIXATION (ORIF) RIGHT ANKLE( BI-MALEOLAR-POSTERIOR AND LATERAL);  Surgeon: Marybelle Killings, MD;  Location: [facility];  Service: Orthopedics;  Laterality: Right;  POPLITEAL BLOCK  . WISDOM TOOTH EXTRACTION   2015   Social History   Occupational History  . Not on file  Tobacco Use  . Smoking status: Former Smoker    Packs/day: 0.25    Quit date: 06/22/2010    Years since quitting: 8.8  . Smokeless tobacco: Never Used  . Tobacco comment: not currently smoking or using marijuana  Substance and Sexual Activity  . Alcohol use: No  . Drug use: No    Types: Marijuana, Other-see comments    Comment: pt states no drugs at this visit  . Sexual activity: Not Currently    Birth control/protection: I.U.D.    Comment: Mirena 09-08-11

## 2019-04-21 NOTE — Therapy (Signed)
[facility] [address] [place], [place], [address] Phone: [phone]   Fax:  [phone]  Physical Therapy Treatment  Patient Details  Name: Rita Long MRN: [id number] Date of Birth: 12-26-1990 Referring Provider (PT): Rodell Perna, MD   Encounter Date: 04/21/2019  PT End of Session - 04/21/19 0937    Visit Number  15    Date for PT Re-Evaluation  05/24/19    Authorization Type  BCBS    PT Start Time  0933    PT Stop Time  1040    PT Time Calculation (min)  67 min    Equipment Utilized During Treatment  Other (comment)    Activity Tolerance  Patient tolerated treatment well    Behavior During Therapy  [facility] for tasks assessed/performed       Past Medical History:  Diagnosis Date  . Fibroid    "unsure"    Past Surgical History:  Procedure Laterality Date  . CESAREAN SECTION  07/01/2011   Procedure: CESAREAN SECTION;  Surgeon: Alwyn Pea, MD;  Location: [facility];  Service: Gynecology;  Laterality: N/A;  . ORIF ANKLE FRACTURE Right 01/18/2019   Procedure: OPEN REDUCTION INTERNAL FIXATION (ORIF) RIGHT ANKLE( BI-MALEOLAR-POSTERIOR AND LATERAL);  Surgeon: Marybelle Killings, MD;  Location: [facility];  Service: Orthopedics;  Laterality: Right;  POPLITEAL BLOCK  . WISDOM TOOTH EXTRACTION  2015    There were no vitals filed for this visit.  Subjective Assessment - 04/21/19 0936    Subjective  I am so readyto be out of this boot. No pain or soreness this AM, no complaints from last sesison. "It was a workout."    Currently in Pain?  No/denies    Multiple Pain Sites  No         [facility] PT Assessment - 04/21/19 0001      AROM   Right Ankle Dorsiflexion  -1   knee bent -1   Right Ankle Plantar Flexion  30    Right Ankle Inversion  25    Right Ankle Eversion  20                   [facility] Adult PT Treatment/Exercise - 04/21/19 0001      Knee/Hip Exercises: Aerobic   Recumbent Bike  L2 x57min       Vasopneumatic   Number Minutes Vasopneumatic   10 minutes    Vasopnuematic Location   Ankle    Vasopneumatic Pressure  Medium    Vasopneumatic Temperature   3 snowflakes      Manual Therapy   Joint Mobilization  Gd 3 mobs for DF, PROM with end range stretching all planes,     Passive ROM  Rt great toe flexion/extension stretch      Ankle Exercises: Stretches   Slant Board Stretch  4 reps;60 seconds   VC to keep knee soft   Other Stretch  DF stretch on second step, VC to keep heel down 20x       Ankle Exercises: Machines for Strengthening   Cybex Leg Press  Seat 3 Bil 70# 15x, RTLE 30# 15x2 , calf press bil 30# 15x      Ankle Exercises: Standing   Rocker Board  1 minute   RTLE very shakey   Rebounder  weight shifting 3 ways on mimi trap 1 min each     Heel Raises  Both;20 reps   2x 10 on mini tramp   Other Standing Ankle  Exercises  closed chain DF on Rt attmpting to bring knee to foam roll 15x5 sec                PT Short Term Goals - 03/29/19 1419      PT SHORT TERM GOAL #4   Title  ambulate on level surface with 1 crutch an full weightbearing on Rt LE    Baseline  WBAT with bil crutches    Status  On-going        PT Long Term Goals - 03/01/19 1211      PT LONG TERM GOAL #1   Title  be independent in advanced HEP    Time  12    Period  Weeks    Status  New    Target Date  04/26/19      PT LONG TERM GOAL #2   Title  reduce FOTO to < or 34% limitation    Time  12    Period  Weeks    Status  New    Target Date  05/24/19      PT LONG TERM GOAL #3   Title  demonstrate 4 to 4+/5 Rt ankle strength to improve stability with gait on unlevel surfaces    Time  12    Period  Weeks    Status  New    Target Date  05/24/19      PT LONG TERM GOAL #4   Title  demonstrate Rt ankle A/ROM DF to 8 degrees to normalize gait    Time  12    Period  Weeks    Status  New    Target Date  05/24/19      PT LONG TERM GOAL #5   Title  ambulate on level surfaces without  boot and demonstrate symmetry    Time  12    Period  Weeks    Status  New    Target Date  05/24/19      PT LONG TERM GOAL #6   Title  improve strength and endurance to stand and walk for 1 hour to allow for return to independence at home and in the community    Time  12    Period  Weeks    Status  New    Target Date  05/24/19            Plan - 04/21/19 [id number]    Clinical Impression Statement  Pt presents pain free today but "tight." Pt required a lot of manual stretching and mobilization to achieve ROM from last measurement. Pt will see MD today and we will await further  instructions on her weightbearing. Pt has been compliant with her weightbearing instructions.    Examination-Activity Limitations  Locomotion Level;Transfers;Stand;Stairs;Squat    Stability/Clinical Decision Making  Stable/Uncomplicated    Rehab Potential  Excellent    PT Frequency  2x / week    PT Duration  12 weeks    PT Treatment/Interventions  ADLs/Self Care Home Management;Cryotherapy;Electrical Stimulation;Gait training;Moist Heat;Functional mobility training;Therapeutic activities;Therapeutic exercise;Neuromuscular re-education;Patient/family education;Manual techniques;Scar mobilization;Passive range of motion;Taping;Vasopneumatic Device    PT Next Visit Plan  To MD today. Follow up with new weightbeaing status next sesison.    PT Home Exercise Plan  Access Code: [email or web address] and Agree with Plan of Care  Patient       Patient will benefit from skilled therapeutic intervention in order to improve the following deficits and impairments:  Abnormal gait, Decreased activity  tolerance, Decreased mobility, Decreased endurance, Difficulty walking, Decreased scar mobility, Decreased strength, Increased edema, Increased muscle spasms, Impaired flexibility, Pain  Visit Diagnosis: Stiffness of right ankle, not elsewhere classified  Other abnormalities of gait and mobility  Muscle weakness  (generalized)  Localized edema  Pain in right ankle and joints of right foot     Problem List Patient Active Problem List   Diagnosis Date Noted  . Bimalleolar fracture of right ankle 01/18/2019  . Closed right ankle fracture 01/18/2019  . Fibroid 09/23/2011  . Short stature 09/23/2011    Melva Faux, PTA 04/21/2019, 10:29 AM  [facility] [address], [place], [place], [address] Phone: [phone]   Fax:  [phone]  Name: Rita Long MRN: [id number] Date of Birth: 28-Feb-1991

## 2019-04-24 ENCOUNTER — Encounter: Payer: Self-pay | Admitting: Orthopaedic Surgery

## 2019-04-24 ENCOUNTER — Encounter: Payer: BLUE CROSS/BLUE SHIELD | Admitting: Physical Therapy

## 2019-04-26 ENCOUNTER — Encounter: Payer: BLUE CROSS/BLUE SHIELD | Admitting: Physical Therapy

## 2019-04-27 ENCOUNTER — Other Ambulatory Visit: Payer: Self-pay

## 2019-04-27 ENCOUNTER — Ambulatory Visit: Payer: BLUE CROSS/BLUE SHIELD

## 2019-04-27 DIAGNOSIS — M25671 Stiffness of right ankle, not elsewhere classified: Secondary | ICD-10-CM | POA: Diagnosis not present

## 2019-04-27 DIAGNOSIS — R2689 Other abnormalities of gait and mobility: Secondary | ICD-10-CM

## 2019-04-27 DIAGNOSIS — M25571 Pain in right ankle and joints of right foot: Secondary | ICD-10-CM

## 2019-04-27 DIAGNOSIS — M6281 Muscle weakness (generalized): Secondary | ICD-10-CM

## 2019-04-27 DIAGNOSIS — R600 Localized edema: Secondary | ICD-10-CM

## 2019-04-27 NOTE — Therapy (Signed)
[facility] [address] [place], [place], [address] Phone: [phone]   Fax:  [phone]  Physical Therapy Treatment  Patient Details  Name: Rita Long MRN: [id number] Date of Birth: Feb 07, 1991 Referring Provider (PT): Rodell Perna, MD   Encounter Date: 04/27/2019  PT End of Session - 04/27/19 0856    Visit Number  16    Date for PT Re-Evaluation  05/24/19    Authorization Type  BCBS    PT Start Time  0801    PT Stop Time  0902    PT Time Calculation (min)  61 min    Activity Tolerance  Patient tolerated treatment well    Behavior During Therapy  [facility] for tasks assessed/performed       Past Medical History:  Diagnosis Date  . Fibroid    "unsure"    Past Surgical History:  Procedure Laterality Date  . CESAREAN SECTION  07/01/2011   Procedure: CESAREAN SECTION;  Surgeon: Alwyn Pea, MD;  Location: [facility];  Service: Gynecology;  Laterality: N/A;  . ORIF ANKLE FRACTURE Right 01/18/2019   Procedure: OPEN REDUCTION INTERNAL FIXATION (ORIF) RIGHT ANKLE( BI-MALEOLAR-POSTERIOR AND LATERAL);  Surgeon: Marybelle Killings, MD;  Location: [facility];  Service: Orthopedics;  Laterality: Right;  POPLITEAL BLOCK  . WISDOM TOOTH EXTRACTION  2015    There were no vitals filed for this visit.  Subjective Assessment - 04/27/19 0810    Subjective  I saw the MD.  He said things are healing well and I can wear my shoes now.  I am losing my balance.    Currently in Pain?  No/denies                       [facility] Adult PT Treatment/Exercise - 04/27/19 0001      Knee/Hip Exercises: Aerobic   Recumbent Bike  L2 x27min   PT present to discuss progress     Vasopneumatic   Number Minutes Vasopneumatic   15 minutes    Vasopnuematic Location   Ankle    Vasopneumatic Pressure  Medium    Vasopneumatic Temperature   3 snowflakes      Ankle Exercises: Stretches   Slant Board Stretch  4 reps;60 seconds   VC to keep  knee soft     Ankle Exercises: Machines for Strengthening   Cybex Leg Press  Seat 3 Bil 70# 15x, RTLE 30# 15x2 , calf press bil 30# 15x      Ankle Exercises: Standing   SLS  5x 10 seconds    Rocker Board  3 minutes    Rebounder  weight shifting 3 ways on mimi trap 1 min each     Heel Raises  Both;20 reps   2x 10 on mini tramp     Ankle Exercises: Seated   BAPS  Level 2;Sitting;15 reps   circles, PF/DF, inv/ev              PT Short Term Goals - 04/27/19 0827      PT SHORT TERM GOAL #4   Title  ambulate on level surface with 1 crutch an full weightbearing on Rt LE    Status  Achieved        PT Long Term Goals - 04/27/19 0825      PT LONG TERM GOAL #1   Title  be independent in advanced HEP    Period  Weeks    Status  On-going  Plan - 04/27/19 0906    Clinical Impression Statement  Pt has weaned from the boot and is wearing a shoe for all distances and surfaces.  Pt reports that she loses her balance at times when walking due to Rt ankle instability.  Pt demonstrates Rt ankle instability with single limb and proprioceptive tasks in the clinic today and requires verbal and demo cues for alignment.  Pt with improved Baps board control with seated Level 2 Baps.  Pt demonstrates instability with gait on level surface without crutch.  PT provided demo and verbal cues for technique including equal stance time and reduced trunk alignment.  Pt will continue to benefit from skilled PT for Rt ankle strength, flexibility, gait and edema management.    PT Frequency  2x / week    PT Duration  12 weeks    PT Treatment/Interventions  ADLs/Self Care Home Management;Cryotherapy;Electrical Stimulation;Gait training;Moist Heat;Functional mobility training;Therapeutic activities;Therapeutic exercise;Neuromuscular re-education;Patient/family education;Manual techniques;Scar mobilization;Passive range of motion;Taping;Vasopneumatic Device    PT Next Visit Plan  work on gait,  balance, strength and proprioception    PT Home Exercise Plan  Access Code: [email or web address]    Consulted and Agree with Plan of Care  Patient       Patient will benefit from skilled therapeutic intervention in order to improve the following deficits and impairments:  Abnormal gait, Decreased activity tolerance, Decreased mobility, Decreased endurance, Difficulty walking, Decreased scar mobility, Decreased strength, Increased edema, Increased muscle spasms, Impaired flexibility, Pain  Visit Diagnosis: Stiffness of right ankle, not elsewhere classified  Other abnormalities of gait and mobility  Muscle weakness (generalized)  Localized edema  Pain in right ankle and joints of right foot     Problem List Patient Active Problem List   Diagnosis Date Noted  . Bimalleolar fracture of right ankle 01/18/2019  . Closed right ankle fracture 01/18/2019  . Fibroid 09/23/2011  . Short stature 09/23/2011     Sigurd Sos, PT 04/27/19 9:09 AM  [facility] [address], [place], [place], [address] Phone: [phone]   Fax:  [phone]  Name: Rita Long MRN: [id number] Date of Birth: 1990/10/17

## 2019-04-28 ENCOUNTER — Other Ambulatory Visit: Payer: Self-pay

## 2019-04-28 ENCOUNTER — Ambulatory Visit: Payer: BLUE CROSS/BLUE SHIELD | Admitting: Physical Therapy

## 2019-04-28 ENCOUNTER — Encounter: Payer: Self-pay | Admitting: Physical Therapy

## 2019-04-28 DIAGNOSIS — M25571 Pain in right ankle and joints of right foot: Secondary | ICD-10-CM

## 2019-04-28 DIAGNOSIS — R600 Localized edema: Secondary | ICD-10-CM

## 2019-04-28 DIAGNOSIS — M25671 Stiffness of right ankle, not elsewhere classified: Secondary | ICD-10-CM

## 2019-04-28 DIAGNOSIS — R2689 Other abnormalities of gait and mobility: Secondary | ICD-10-CM

## 2019-04-28 DIAGNOSIS — M6281 Muscle weakness (generalized): Secondary | ICD-10-CM

## 2019-04-28 NOTE — Therapy (Signed)
[facility] [address] [place], [place], [address] Phone: [phone]   Fax:  [phone]  Physical Therapy Treatment  Patient Details  Name: Rita Long MRN: [id number] Date of Birth: [birth year] Referring Provider (PT): Rodell Perna, MD   Encounter Date: 04/28/2019  PT End of Session - 04/28/19 0935    Visit Number  17    Date for PT Re-Evaluation  05/24/19    Authorization Type  BCBS    PT Start Time  0933    PT Stop Time  1030    PT Time Calculation (min)  57 min    Activity Tolerance  Patient tolerated treatment well    Behavior During Therapy  [facility] for tasks assessed/performed       Past Medical History:  Diagnosis Date  . Fibroid    "unsure"    Past Surgical History:  Procedure Laterality Date  . CESAREAN SECTION  07/01/2011   Procedure: CESAREAN SECTION;  Surgeon: Alwyn Pea, MD;  Location: [facility];  Service: Gynecology;  Laterality: N/A;  . ORIF ANKLE FRACTURE Right 01/18/2019   Procedure: OPEN REDUCTION INTERNAL FIXATION (ORIF) RIGHT ANKLE( BI-MALEOLAR-POSTERIOR AND LATERAL);  Surgeon: Marybelle Killings, MD;  Location: [facility];  Service: Orthopedics;  Laterality: Right;  POPLITEAL BLOCK  . WISDOM TOOTH EXTRACTION  2015    There were no vitals filed for this visit.  Subjective Assessment - 04/28/19 0936    Subjective  No soreness or pain. I did have some swelling but I have been on my feet a lot. When I rest the swelling goes down.    Currently in Pain?  No/denies    Multiple Pain Sites  No                       [facility] Adult PT Treatment/Exercise - 04/28/19 0001      Vasopneumatic   Number Minutes Vasopneumatic   10 minutes    Vasopnuematic Location   Ankle    Vasopneumatic Pressure  Medium    Vasopneumatic Temperature   3 snowflakes      Ankle Exercises: Aerobic   Elliptical  L2 R 2 x 5 min       Ankle Exercises: Standing   SLS  5x 20 seconds    Vc to use less UE  support   Rocker Board  3 minutes    Heel Raises  Both;20 reps    Other Standing Ankle Exercises  20# resisted walking 5x in each direction      Ankle Exercises: Seated   Other Seated Ankle Exercises  Sit to stand 10x2 with RTLE on the black pad        Ankle Exercises: Stretches   Other Stretch  DF stretch on second step, VC to keep heel down 20x       Ankle Exercises: Machines for Strengthening   Cybex Leg Press  Seat 3 Bil 75# 15x, RTLE 35# 15x, calf press bil 35# 20x               PT Short Term Goals - 04/27/19 0827      PT SHORT TERM GOAL #4   Title  ambulate on level surface with 1 crutch an full weightbearing on Rt LE    Status  Achieved        PT Long Term Goals - 04/27/19 0825      PT LONG TERM GOAL #1   Title  be independent  in advanced HEP    Period  Weeks    Status  On-going            Plan - 04/28/19 0935    Clinical Impression Statement  Pt arrives with no soreness or pain. Gait is improving, remains with mild limp. Pt  increased her single leg stance to 20 sec with a few UE touches for balance. Posterior ankle/achilles remain tight, PTA encouraged pt to continue stretching her achilles throughout the day. Some swelling alos remains.    Examination-Activity Limitations  Locomotion Level;Transfers;Stand;Stairs;Squat    Examination-Participation Restrictions  Meal Prep    Stability/Clinical Decision Making  Stable/Uncomplicated    Rehab Potential  Excellent    PT Frequency  2x / week    PT Duration  12 weeks    PT Treatment/Interventions  ADLs/Self Care Home Management;Cryotherapy;Electrical Stimulation;Gait training;Moist Heat;Functional mobility training;Therapeutic activities;Therapeutic exercise;Neuromuscular re-education;Patient/family education;Manual techniques;Scar mobilization;Passive range of motion;Taping;Vasopneumatic Device    PT Next Visit Plan  work on gait, balance, strength and proprioception    PT Home Exercise Plan  Access Code:  [email or web address]    Consulted and Agree with Plan of Care  Patient       Patient will benefit from skilled therapeutic intervention in order to improve the following deficits and impairments:  Abnormal gait, Decreased activity tolerance, Decreased mobility, Decreased endurance, Difficulty walking, Decreased scar mobility, Decreased strength, Increased edema, Increased muscle spasms, Impaired flexibility, Pain  Visit Diagnosis: Stiffness of right ankle, not elsewhere classified  Other abnormalities of gait and mobility  Muscle weakness (generalized)  Localized edema  Pain in right ankle and joints of right foot     Problem List Patient Active Problem List   Diagnosis Date Noted  . Bimalleolar fracture of right ankle 01/18/2019  . Closed right ankle fracture 01/18/2019  . Fibroid 09/23/2011  . Short stature 09/23/2011    Lynford Espinoza, PTA 04/28/2019, 10:12 AM  [facility] [address], [place], [place], [address] Phone: [phone]   Fax:  [phone]  Name: Yamiled Picone MRN: [id number] Date of Birth: May 01, 1991

## 2019-05-03 ENCOUNTER — Encounter: Payer: Self-pay | Admitting: Physical Therapy

## 2019-05-03 ENCOUNTER — Ambulatory Visit: Payer: BLUE CROSS/BLUE SHIELD | Admitting: Physical Therapy

## 2019-05-03 ENCOUNTER — Other Ambulatory Visit: Payer: Self-pay

## 2019-05-03 DIAGNOSIS — M25671 Stiffness of right ankle, not elsewhere classified: Secondary | ICD-10-CM | POA: Diagnosis not present

## 2019-05-03 DIAGNOSIS — M25571 Pain in right ankle and joints of right foot: Secondary | ICD-10-CM

## 2019-05-03 DIAGNOSIS — M6281 Muscle weakness (generalized): Secondary | ICD-10-CM

## 2019-05-03 DIAGNOSIS — R2689 Other abnormalities of gait and mobility: Secondary | ICD-10-CM

## 2019-05-03 DIAGNOSIS — R600 Localized edema: Secondary | ICD-10-CM

## 2019-05-03 NOTE — Therapy (Signed)
[facility] [address], [place], [place], [address] Phone: [phone]   Fax:  [phone]  Physical Therapy Treatment  Patient Details  Name: Rita Long MRN: [id number] Date of Birth: Oct 19, 1990 Referring Provider (PT): Rodell Perna, MD   Encounter Date: 05/03/2019  PT End of Session - 05/03/19 0802    Visit Number  18    Date for PT Re-Evaluation  05/24/19    Authorization Type  BCBS    PT Start Time  0759    PT Stop Time  0850    PT Time Calculation (min)  51 min    Activity Tolerance  Patient tolerated treatment well    Behavior During Therapy  [facility] for tasks assessed/performed       Past Medical History:  Diagnosis Date  . Fibroid    "unsure"    Past Surgical History:  Procedure Laterality Date  . CESAREAN SECTION  07/01/2011   Procedure: CESAREAN SECTION;  Surgeon: Alwyn Pea, MD;  Location: [facility];  Service: Gynecology;  Laterality: N/A;  . ORIF ANKLE FRACTURE Right 01/18/2019   Procedure: OPEN REDUCTION INTERNAL FIXATION (ORIF) RIGHT ANKLE( BI-MALEOLAR-POSTERIOR AND LATERAL);  Surgeon: Marybelle Killings, MD;  Location: [facility];  Service: Orthopedics;  Laterality: Right;  POPLITEAL BLOCK  . WISDOM TOOTH EXTRACTION  2015    There were no vitals filed for this visit.  Subjective Assessment - 05/03/19 0801    Subjective  Doing well. Pt has been very active from moving residence. Ankle is "a little sore."    Currently in Pain?  Yes    Pain Score  1     Pain Location  Ankle    Pain Orientation  Right    Pain Descriptors / Indicators  Sore    Aggravating Factors   Just overdoing    Pain Relieving Factors  Rest    Multiple Pain Sites  No                       [facility] Adult PT Treatment/Exercise - 05/03/19 0001      Knee/Hip Exercises: Standing   Heel Raises  Both;1 set;20 reps      Ankle Exercises: Aerobic   Elliptical  L3 R 3 7 min with discussion of status      Ankle  Exercises: Standing   SLS  With green band perturbations 30 sec in each plane.   Required Great toe touch down for balance   Rocker Board  3 minutes    Braiding (Round Trip)  Resisted walking 25# 6x in eac hdirection    Other Standing Ankle Exercises  Floor slider: SLS RT 10 eac direction with LTLE sliding   VC to femoral alignment     Ankle Exercises: Machines for Strengthening   Cybex Leg Press  Seat 3 RTLE 35# 2x15, calf press 35# 2x 20      Ankle Exercises: Stretches   Other Stretch  DF stretch on second step, VC to keep heel down 20x                PT Short Term Goals - 04/27/19 0827      PT SHORT TERM GOAL #4   Title  ambulate on level surface with 1 crutch an full weightbearing on Rt LE    Status  Achieved        PT Long Term Goals - 04/27/19 0825      PT LONG TERM GOAL #  1   Title  be independent in advanced HEP    Period  Weeks    Status  On-going            Plan - 05/03/19 0803    Clinical Impression Statement  Pt arrives today with very minor soreness in her ankle. Pt requires toe touch from contalateral foot during single leg stance with perturbations. RTLE weakness shows in her lunging/floor slider exercise as she frequently lost her balance. Pt was able to regain balanceand not fall.    Examination-Activity Limitations  Locomotion Level;Transfers;Stand;Stairs;Squat    Examination-Participation Restrictions  Meal Prep    Stability/Clinical Decision Making  Stable/Uncomplicated    Rehab Potential  Excellent    PT Frequency  2x / week    PT Duration  12 weeks    PT Treatment/Interventions  ADLs/Self Care Home Management;Cryotherapy;Electrical Stimulation;Gait training;Moist Heat;Functional mobility training;Therapeutic activities;Therapeutic exercise;Neuromuscular re-education;Patient/family education;Manual techniques;Scar mobilization;Passive range of motion;Taping;Vasopneumatic Device    PT Next Visit Plan  work on gait, balance, strength and  proprioception       Patient will benefit from skilled therapeutic intervention in order to improve the following deficits and impairments:  Abnormal gait, Decreased activity tolerance, Decreased mobility, Decreased endurance, Difficulty walking, Decreased scar mobility, Decreased strength, Increased edema, Increased muscle spasms, Impaired flexibility, Pain  Visit Diagnosis: Stiffness of right ankle, not elsewhere classified  Other abnormalities of gait and mobility  Muscle weakness (generalized)  Localized edema  Pain in right ankle and joints of right foot     Problem List Patient Active Problem List   Diagnosis Date Noted  . Bimalleolar fracture of right ankle 01/18/2019  . Closed right ankle fracture 01/18/2019  . Fibroid 09/23/2011  . Short stature 09/23/2011    Cadi Rhinehart, PTA 05/03/2019, 8:36 AM  [facility] [address], [place], [place], [address] Phone: [phone]   Fax:  [phone]  Name: Jalesha Barton MRN: [id number] Date of Birth: 02/19/1991

## 2019-05-04 ENCOUNTER — Ambulatory Visit: Payer: BLUE CROSS/BLUE SHIELD

## 2019-05-04 ENCOUNTER — Other Ambulatory Visit: Payer: Self-pay

## 2019-05-04 DIAGNOSIS — M25571 Pain in right ankle and joints of right foot: Secondary | ICD-10-CM

## 2019-05-04 DIAGNOSIS — M25671 Stiffness of right ankle, not elsewhere classified: Secondary | ICD-10-CM | POA: Diagnosis not present

## 2019-05-04 DIAGNOSIS — R2689 Other abnormalities of gait and mobility: Secondary | ICD-10-CM

## 2019-05-04 DIAGNOSIS — R600 Localized edema: Secondary | ICD-10-CM

## 2019-05-04 DIAGNOSIS — M6281 Muscle weakness (generalized): Secondary | ICD-10-CM

## 2019-05-04 NOTE — Therapy (Signed)
[facility] [address] [place], [place], [address] Phone: [phone]   Fax:  [phone]  Physical Therapy Treatment  Patient Details  Name: Rita Long MRN: [id number] Date of Birth: 1990-09-01 Referring Provider (PT): Rodell Perna, MD   Encounter Date: 05/04/2019  PT End of Session - 05/04/19 0845    Visit Number  19    Date for PT Re-Evaluation  05/24/19    Authorization Type  BCBS    PT Start Time  0800    PT Stop Time  0853    PT Time Calculation (min)  53 min    Activity Tolerance  Patient tolerated treatment well    Behavior During Therapy  [facility] for tasks assessed/performed       Past Medical History:  Diagnosis Date  . Fibroid    "unsure"    Past Surgical History:  Procedure Laterality Date  . CESAREAN SECTION  07/01/2011   Procedure: CESAREAN SECTION;  Surgeon: Alwyn Pea, MD;  Location: [facility];  Service: Gynecology;  Laterality: N/A;  . ORIF ANKLE FRACTURE Right 01/18/2019   Procedure: OPEN REDUCTION INTERNAL FIXATION (ORIF) RIGHT ANKLE( BI-MALEOLAR-POSTERIOR AND LATERAL);  Surgeon: Marybelle Killings, MD;  Location: [place];  Service: Orthopedics;  Laterality: Right;  POPLITEAL BLOCK  . WISDOM TOOTH EXTRACTION  2015    There were no vitals filed for this visit.  Subjective Assessment - 05/04/19 0809    Subjective  I'm still limping but I'm doing better.    Currently in Pain?  No/denies                       [facility] Adult PT Treatment/Exercise - 05/04/19 0001      Knee/Hip Exercises: Standing   Heel Raises  Both;2 sets;15 reps      Vasopneumatic   Number Minutes Vasopneumatic   10 minutes    Vasopnuematic Location   Ankle    Vasopneumatic Pressure  Medium    Vasopneumatic Temperature   3 snowflakes      Ankle Exercises: Aerobic   Elliptical  L3 R 3 7 min with discussion of status      Ankle Exercises: Standing   SLS  With green band perturbations 30 sec in each  plane.   Required Great toe touch down for balance   Rocker Board  3 minutes    Braiding (Round Trip)  Resisted walking 25# 10x in eac hdirection      Ankle Exercises: Machines for Strengthening   Cybex Leg Press  Seat 3 RTLE 35# 2x15, calf press 35# 2x 20      Ankle Exercises: Stretches   Other Stretch  DF stretch on second step, VC to keep heel down 20x                PT Short Term Goals - 04/27/19 0827      PT SHORT TERM GOAL #4   Title  ambulate on level surface with 1 crutch an full weightbearing on Rt LE    Status  Achieved        PT Long Term Goals - 04/27/19 0825      PT LONG TERM GOAL #1   Title  be independent in advanced HEP    Period  Weeks    Status  On-going            Plan - 05/04/19 0837    Clinical Impression Statement  Pt is making steady  progress with mobility and gait pattern out of the boot.  Pt demonstrated reduced eccentric control with step-downs on the Rt.  Pt is challenged with single limb activity with perturbations and requires great toe touch on the Lt.  Pt with improved gait pattern with very little antalgia due to reduced time spent in stance on the Rt.  Pt is able to correct with verbal cues.  Pt will continue to benefit from skilled PT for Rt ankle strength, proprioception, flexibility, edema management and gait.    PT Frequency  2x / week    PT Duration  12 weeks    PT Treatment/Interventions  ADLs/Self Care Home Management;Cryotherapy;Electrical Stimulation;Gait training;Moist Heat;Functional mobility training;Therapeutic activities;Therapeutic exercise;Neuromuscular re-education;Patient/family education;Manual techniques;Scar mobilization;Passive range of motion;Taping;Vasopneumatic Device    PT Next Visit Plan  work on gait, balance, strength and proprioception.  Give FOTO next session to check status on goal.    PT Home Exercise Plan  Access Code: [email or web address]       Patient will benefit from skilled therapeutic intervention in  order to improve the following deficits and impairments:  Abnormal gait, Decreased activity tolerance, Decreased mobility, Decreased endurance, Difficulty walking, Decreased scar mobility, Decreased strength, Increased edema, Increased muscle spasms, Impaired flexibility, Pain  Visit Diagnosis: Other abnormalities of gait and mobility  Stiffness of right ankle, not elsewhere classified  Muscle weakness (generalized)  Localized edema  Pain in right ankle and joints of right foot     Problem List Patient Active Problem List   Diagnosis Date Noted  . Bimalleolar fracture of right ankle 01/18/2019  . Closed right ankle fracture 01/18/2019  . Fibroid 09/23/2011  . Short stature 09/23/2011     Sigurd Sos, PT 05/04/19 8:47 AM  [facility] [address] [place], [place], [address] Phone: [phone]   Fax:  [phone]  Name: Rita Long MRN: [id number] Date of Birth: [birth year]

## 2019-05-05 ENCOUNTER — Encounter: Payer: BLUE CROSS/BLUE SHIELD | Admitting: Physical Therapy

## 2019-05-09 ENCOUNTER — Other Ambulatory Visit (INDEPENDENT_AMBULATORY_CARE_PROVIDER_SITE_OTHER): Payer: Self-pay | Admitting: Surgery

## 2019-05-09 NOTE — Telephone Encounter (Signed)
Patient is almost 4 months postop and has been released by Dr. Lorin Mercy.  Why is she taking this medication?

## 2019-05-09 NOTE — Telephone Encounter (Signed)
Please advise 

## 2019-05-09 NOTE — Telephone Encounter (Signed)
Called patient Rx not needed. Refill request was automatically sent by pharmacy.

## 2019-05-10 ENCOUNTER — Encounter: Payer: BLUE CROSS/BLUE SHIELD | Admitting: Physical Therapy

## 2019-05-15 ENCOUNTER — Other Ambulatory Visit: Payer: Self-pay

## 2019-05-15 ENCOUNTER — Ambulatory Visit: Payer: BLUE CROSS/BLUE SHIELD

## 2019-05-15 DIAGNOSIS — R2689 Other abnormalities of gait and mobility: Secondary | ICD-10-CM

## 2019-05-15 DIAGNOSIS — M6281 Muscle weakness (generalized): Secondary | ICD-10-CM

## 2019-05-15 DIAGNOSIS — M25571 Pain in right ankle and joints of right foot: Secondary | ICD-10-CM

## 2019-05-15 DIAGNOSIS — M25671 Stiffness of right ankle, not elsewhere classified: Secondary | ICD-10-CM | POA: Diagnosis not present

## 2019-05-15 DIAGNOSIS — R600 Localized edema: Secondary | ICD-10-CM

## 2019-05-15 NOTE — Therapy (Signed)
[facility] [address] [place], [place], [address] Phone: [phone]   Fax:  [phone]  Physical Therapy Treatment  Patient Details  Name: Rita Long MRN: [id number] Date of Birth: [birth year] Referring Provider (PT): Rodell Perna, MD   Encounter Date: 05/15/2019  PT End of Session - 05/15/19 1528    Visit Number  20    Date for PT Re-Evaluation  05/24/19    Authorization Type  BCBS    PT Start Time  1446    PT Stop Time  1539    PT Time Calculation (min)  53 min    Activity Tolerance  Patient tolerated treatment well    Behavior During Therapy  [facility] for tasks assessed/performed       Past Medical History:  Diagnosis Date  . Fibroid    "unsure"    Past Surgical History:  Procedure Laterality Date  . CESAREAN SECTION  07/01/2011   Procedure: CESAREAN SECTION;  Surgeon: Alwyn Pea, MD;  Location: [facility];  Service: Gynecology;  Laterality: N/A;  . ORIF ANKLE FRACTURE Right 01/18/2019   Procedure: OPEN REDUCTION INTERNAL FIXATION (ORIF) RIGHT ANKLE( BI-MALEOLAR-POSTERIOR AND LATERAL);  Surgeon: Marybelle Killings, MD;  Location: [place];  Service: Orthopedics;  Laterality: Right;  POPLITEAL BLOCK  . WISDOM TOOTH EXTRACTION  2015    There were no vitals filed for this visit.  Subjective Assessment - 05/15/19 1529    Subjective  The top of my Rt foot still feels numb.    Currently in Pain?  No/denies         [facility] PT Assessment - 05/15/19 0001      Observation/Other Assessments   Focus on Therapeutic Outcomes (FOTO)   30% limitation                   [facility] Adult PT Treatment/Exercise - 05/15/19 0001      Knee/Hip Exercises: Standing   Heel Raises  Both;2 sets;15 reps    Heel Raises Limitations  on black balance pad    Forward Step Up  Right;2 sets;10 reps    Forward Step Up Limitations  on Bosu ball      Vasopneumatic   Number Minutes Vasopneumatic   10 minutes    Vasopnuematic  Location   Ankle    Vasopneumatic Pressure  Medium    Vasopneumatic Temperature   3 snowflakes      Ankle Exercises: Aerobic   Elliptical  L3 R 3 8 min with discussion of status      Ankle Exercises: Standing   SLS  with D1 pull downs 2x10    Braiding (Round Trip)  Resisted walking 25# 10x in each direction      Ankle Exercises: Machines for Strengthening   Cybex Leg Press  Seat 3 RTLE 50# 2x15, calf press 35# 2x 20, 70# bil legs               PT Short Term Goals - 04/27/19 0827      PT SHORT TERM GOAL #4   Title  ambulate on level surface with 1 crutch an full weightbearing on Rt LE    Status  Achieved        PT Long Term Goals - 05/15/19 1450      PT LONG TERM GOAL #2   Title  reduce FOTO to < or 34% limitation    Baseline  30% limitation    Status  Achieved  PT LONG TERM GOAL #5   Title  ambulate on level surfaces without boot and demonstrate symmetry    Baseline  mild assymmetry with gait    Time  12    Period  Weeks    Status  On-going      PT LONG TERM GOAL #6   Title  improve strength and endurance to stand and walk for 1 hour to allow for return to independence at home and in the community    Baseline  30 minutes    Time  12    Period  Weeks    Status  On-going            Plan - 05/15/19 1500    Clinical Impression Statement  Pt demonstrates improved gait pattern on level surfaces with more consistent heel strike.  Pt is able to walk for at least 30 minutes and has not tried to push past this distance.  Pt with improved FOTO score to 30% limitation indicating significant improvement in functional mobility. Pt is challenged with single limb activity on the Rt with perturbations.  Pt will continue to benefit from skilled PT to improve strength, proprioception and gait s/p surgery.    PT Frequency  2x / week    PT Duration  12 weeks    PT Treatment/Interventions  ADLs/Self Care Home Management;Cryotherapy;Electrical Stimulation;Gait  training;Moist Heat;Functional mobility training;Therapeutic activities;Therapeutic exercise;Neuromuscular re-education;Patient/family education;Manual techniques;Scar mobilization;Passive range of motion;Taping;Vasopneumatic Device    PT Next Visit Plan  work on gait, balance, strength and proprioception.    PT Home Exercise Plan  Access Code: [email or web address] and Agree with Plan of Care  Patient       Patient will benefit from skilled therapeutic intervention in order to improve the following deficits and impairments:  Abnormal gait, Decreased activity tolerance, Decreased mobility, Decreased endurance, Difficulty walking, Decreased scar mobility, Decreased strength, Increased edema, Increased muscle spasms, Impaired flexibility, Pain  Visit Diagnosis: Other abnormalities of gait and mobility  Stiffness of right ankle, not elsewhere classified  Muscle weakness (generalized)  Localized edema  Pain in right ankle and joints of right foot     Problem List Patient Active Problem List   Diagnosis Date Noted  . Bimalleolar fracture of right ankle 01/18/2019  . Closed right ankle fracture 01/18/2019  . Fibroid 09/23/2011  . Short stature 09/23/2011    Sigurd Sos, PT 05/15/19 3:29 PM  [facility] [address], [place], [place], [address] Phone: [phone]   Fax:  [phone]  Name: Rita Long MRN: [id number] Date of Birth: 11/11/1990

## 2019-05-16 ENCOUNTER — Other Ambulatory Visit: Payer: Self-pay

## 2019-05-16 DIAGNOSIS — Z20822 Contact with and (suspected) exposure to covid-19: Secondary | ICD-10-CM

## 2019-05-18 ENCOUNTER — Ambulatory Visit: Payer: BLUE CROSS/BLUE SHIELD | Attending: Orthopaedic Surgery

## 2019-05-18 ENCOUNTER — Other Ambulatory Visit: Payer: Self-pay

## 2019-05-18 DIAGNOSIS — R2689 Other abnormalities of gait and mobility: Secondary | ICD-10-CM | POA: Diagnosis not present

## 2019-05-18 DIAGNOSIS — M25571 Pain in right ankle and joints of right foot: Secondary | ICD-10-CM | POA: Diagnosis present

## 2019-05-18 DIAGNOSIS — R600 Localized edema: Secondary | ICD-10-CM | POA: Insufficient documentation

## 2019-05-18 DIAGNOSIS — M6281 Muscle weakness (generalized): Secondary | ICD-10-CM | POA: Insufficient documentation

## 2019-05-18 DIAGNOSIS — M25671 Stiffness of right ankle, not elsewhere classified: Secondary | ICD-10-CM | POA: Insufficient documentation

## 2019-05-18 LAB — NOVEL CORONAVIRUS, NAA: SARS-CoV-2, NAA: NOT DETECTED

## 2019-05-18 NOTE — Therapy (Signed)
[facility] [address] [place], [place], [address] Phone: [phone]   Fax:  [phone]  Physical Therapy Treatment  Patient Details  Name: Rita Long MRN: [id number] Date of Birth: [birth year] Referring Provider (PT): Rodell Perna, MD   Encounter Date: 05/18/2019  PT End of Session - 05/18/19 0849    Visit Number  21    Date for PT Re-Evaluation  05/24/19    Authorization Type  BCBS    PT Start Time  0806    PT Stop Time  0856    PT Time Calculation (min)  50 min    Activity Tolerance  Patient tolerated treatment well    Behavior During Therapy  [facility] for tasks assessed/performed       Past Medical History:  Diagnosis Date  . Fibroid    "unsure"    Past Surgical History:  Procedure Laterality Date  . CESAREAN SECTION  07/01/2011   Procedure: CESAREAN SECTION;  Surgeon: Alwyn Pea, MD;  Location: [facility];  Service: Gynecology;  Laterality: N/A;  . ORIF ANKLE FRACTURE Right 01/18/2019   Procedure: OPEN REDUCTION INTERNAL FIXATION (ORIF) RIGHT ANKLE( BI-MALEOLAR-POSTERIOR AND LATERAL);  Surgeon: Marybelle Killings, MD;  Location: [place];  Service: Orthopedics;  Laterality: Right;  POPLITEAL BLOCK  . WISDOM TOOTH EXTRACTION  2015    There were no vitals filed for this visit.  Subjective Assessment - 05/18/19 0808    Subjective  I'm doing well.  No pain today.    Currently in Pain?  No/denies                       [facility] Adult PT Treatment/Exercise - 05/18/19 0001      Knee/Hip Exercises: Standing   Heel Raises  Both;2 sets;15 reps    Heel Raises Limitations  on black balance pad    Forward Step Up  Right;2 sets;10 reps    Forward Step Up Limitations  on Bosu ball    SLS  with plyotoss red ball 2x10, tandem stance: Rt and Lt 2x10 each    Walking with Sports Cord  25# 4 ways x 10 each      Vasopneumatic   Number Minutes Vasopneumatic   10 minutes    Vasopnuematic Location   Ankle     Vasopneumatic Pressure  Medium    Vasopneumatic Temperature   3 snowflakes      Ankle Exercises: Aerobic   Elliptical  L4 R 5 8 min with discussion of status      Ankle Exercises: Machines for Strengthening   Cybex Leg Press  Seat 3 RTLE 50# 2x15, calf press 35# 2x 20, 70# bil legs               PT Short Term Goals - 04/27/19 0827      PT SHORT TERM GOAL #4   Title  ambulate on level surface with 1 crutch an full weightbearing on Rt LE    Status  Achieved        PT Long Term Goals - 05/15/19 1450      PT LONG TERM GOAL #2   Title  reduce FOTO to < or 34% limitation    Baseline  30% limitation    Status  Achieved      PT LONG TERM GOAL #5   Title  ambulate on level surfaces without boot and demonstrate symmetry    Baseline  mild assymmetry with gait  Time  12    Period  Weeks    Status  On-going      PT LONG TERM GOAL #6   Title  improve strength and endurance to stand and walk for 1 hour to allow for return to independence at home and in the community    Baseline  30 minutes    Time  12    Period  Weeks    Status  On-going            Plan - 05/18/19 0831    Clinical Impression Statement  Pt is wearing ASO for ambulation now to improve ankle stability. Pt is able to advance proprioceptive activity today and is challenged by single leg stance with plyotoss with SLS.  Pt is able to consistently heel strike with gait on level surface. FOTO was significantly improved this week indicating improved functional mobility.  Pt requires minor verbal cues for hip alignment with single limb activity today.  Pt will continue to benefit from skilled PT for Rt ankle and LE strength, proprioception, gait  and edema management.    PT Frequency  2x / week    PT Duration  12 weeks    PT Treatment/Interventions  ADLs/Self Care Home Management;Cryotherapy;Electrical Stimulation;Gait training;Moist Heat;Functional mobility training;Therapeutic activities;Therapeutic  exercise;Neuromuscular re-education;Patient/family education;Manual techniques;Scar mobilization;Passive range of motion;Taping;Vasopneumatic Device    PT Next Visit Plan  work on gait, balance, strength and proprioception.    PT Home Exercise Plan  Access Code: [email or web address] and Agree with Plan of Care  Patient       Patient will benefit from skilled therapeutic intervention in order to improve the following deficits and impairments:  Abnormal gait, Decreased activity tolerance, Decreased mobility, Decreased endurance, Difficulty walking, Decreased scar mobility, Decreased strength, Increased edema, Increased muscle spasms, Impaired flexibility, Pain  Visit Diagnosis: Other abnormalities of gait and mobility  Stiffness of right ankle, not elsewhere classified  Muscle weakness (generalized)  Localized edema  Pain in right ankle and joints of right foot     Problem List Patient Active Problem List   Diagnosis Date Noted  . Bimalleolar fracture of right ankle 01/18/2019  . Closed right ankle fracture 01/18/2019  . Fibroid 09/23/2011  . Short stature 09/23/2011    Sigurd Sos, PT 05/18/19 8:52 AM  [facility] [address] [place], [place], [address] Phone: [phone]   Fax:  [phone]  Name: Rita Long MRN: [id number] Date of Birth: 18-Feb-1991

## 2019-05-22 ENCOUNTER — Other Ambulatory Visit: Payer: Self-pay

## 2019-05-22 ENCOUNTER — Ambulatory Visit: Payer: BLUE CROSS/BLUE SHIELD

## 2019-05-22 DIAGNOSIS — M25671 Stiffness of right ankle, not elsewhere classified: Secondary | ICD-10-CM

## 2019-05-22 DIAGNOSIS — M25571 Pain in right ankle and joints of right foot: Secondary | ICD-10-CM

## 2019-05-22 DIAGNOSIS — R2689 Other abnormalities of gait and mobility: Secondary | ICD-10-CM | POA: Diagnosis not present

## 2019-05-22 DIAGNOSIS — R600 Localized edema: Secondary | ICD-10-CM

## 2019-05-22 DIAGNOSIS — M6281 Muscle weakness (generalized): Secondary | ICD-10-CM

## 2019-05-22 NOTE — Therapy (Signed)
[facility] [address], [place], [place], [address] Phone: [phone]   Fax:  [phone]  Physical Therapy Treatment  Patient Details  Name: Rita Long MRN: [id number] Date of Birth: 08-13-1990 Referring Provider (PT): Rodell Perna, MD   Encounter Date: 05/22/2019  PT End of Session - 05/22/19 1648    Visit Number  22    Date for PT Re-Evaluation  05/24/19    Authorization Type  BCBS    PT Start Time  [id number]    PT Stop Time  1659    PT Time Calculation (min)  45 min    Activity Tolerance  Patient tolerated treatment well    Behavior During Therapy  [facility] for tasks assessed/performed       Past Medical History:  Diagnosis Date  . Fibroid    "unsure"    Past Surgical History:  Procedure Laterality Date  . CESAREAN SECTION  07/01/2011   Procedure: CESAREAN SECTION;  Surgeon: Alwyn Pea, MD;  Location: [facility];  Service: Gynecology;  Laterality: N/A;  . ORIF ANKLE FRACTURE Right 01/18/2019   Procedure: OPEN REDUCTION INTERNAL FIXATION (ORIF) RIGHT ANKLE( BI-MALEOLAR-POSTERIOR AND LATERAL);  Surgeon: Marybelle Killings, MD;  Location: [facility];  Service: Orthopedics;  Laterality: Right;  POPLITEAL BLOCK  . WISDOM TOOTH EXTRACTION  2015    There were no vitals filed for this visit.  Subjective Assessment - 05/22/19 1616    Subjective  I have increased ankle pain- I carried water yesterday.   I will be ready to D/C next session.    Currently in Pain?  Yes    Pain Score  5     Pain Location  Ankle    Pain Orientation  Right    Pain Descriptors / Indicators  Sore    Pain Onset  More than a month ago    Pain Frequency  Intermittent    Aggravating Factors   carrying heavy objects    Pain Relieving Factors  rest, ice                       [facility] Adult PT Treatment/Exercise - 05/22/19 0001      Knee/Hip Exercises: Standing   Heel Raises  Both;2 sets;15 reps    Heel Raises Limitations  on black  balance pad    Forward Step Up  Right;2 sets;10 reps    Forward Step Up Limitations  on Bosu ball    SLS  with plyotoss red ball 2x10, tandem stance: Rt and Lt 2x10 each    Walking with Sports Cord  30# 4 ways x 10 each      Vasopneumatic   Number Minutes Vasopneumatic   10 minutes    Vasopnuematic Location   Ankle    Vasopneumatic Pressure  Medium    Vasopneumatic Temperature   3 snowflakes      Ankle Exercises: Aerobic   Elliptical  L4 R 5 8 min with discussion of status      Ankle Exercises: Machines for Strengthening   Cybex Leg Press  Seat 3 RTLE 50# 2x15, calf press 35# 2x 20, 70# bil legs               PT Short Term Goals - 04/27/19 0827      PT SHORT TERM GOAL #4   Title  ambulate on level surface with 1 crutch an full weightbearing on Rt LE    Status  Achieved  PT Long Term Goals - 05/22/19 1617      PT LONG TERM GOAL #2   Title  reduce FOTO to < or 34% limitation    Status  Achieved      PT LONG TERM GOAL #5   Title  ambulate on level surfaces without boot and demonstrate symmetry    Status  Achieved            Plan - 05/22/19 1624    Clinical Impression Statement  Pt with symmetry with gait on level surface and is able to walk for 1 hour without limitation.  Pt had a flare-up of pain due to carrying a case of water.  Pt with improved Rt LE and hip stability with single limb activity.  Pt requires minor verbal cues to reduce hip adduction and knee hyperextension.  Pt will attend 1 more session to finalize HEP and perform final measurements.    PT Frequency  2x / week    PT Duration  12 weeks    PT Treatment/Interventions  ADLs/Self Care Home Management;Cryotherapy;Electrical Stimulation;Gait training;Moist Heat;Functional mobility training;Therapeutic activities;Therapeutic exercise;Neuromuscular re-education;Patient/family education;Manual techniques;Scar mobilization;Passive range of motion;Taping;Vasopneumatic Device    PT Next Visit Plan   1 more session.  Take strength and A/ROM measurements    PT Home Exercise Plan  Access Code: [email or web address]    Consulted and Agree with Plan of Care  Patient       Patient will benefit from skilled therapeutic intervention in order to improve the following deficits and impairments:  Abnormal gait, Decreased activity tolerance, Decreased mobility, Decreased endurance, Difficulty walking, Decreased scar mobility, Decreased strength, Increased edema, Increased muscle spasms, Impaired flexibility, Pain  Visit Diagnosis: Stiffness of right ankle, not elsewhere classified  Other abnormalities of gait and mobility  Muscle weakness (generalized)  Localized edema  Pain in right ankle and joints of right foot     Problem List Patient Active Problem List   Diagnosis Date Noted  . Bimalleolar fracture of right ankle 01/18/2019  . Closed right ankle fracture 01/18/2019  . Fibroid 09/23/2011  . Short stature 09/23/2011     Sigurd Sos, PT 05/22/19 4:50 PM  [facility] [address], [place], [place], [address] Phone: [phone]   Fax:  [phone]  Name: Catera Kissler MRN: [id number] Date of Birth: [birth year]

## 2019-05-24 ENCOUNTER — Ambulatory Visit: Payer: BLUE CROSS/BLUE SHIELD

## 2019-05-24 ENCOUNTER — Other Ambulatory Visit: Payer: Self-pay

## 2019-05-24 DIAGNOSIS — R2689 Other abnormalities of gait and mobility: Secondary | ICD-10-CM

## 2019-05-24 DIAGNOSIS — M25671 Stiffness of right ankle, not elsewhere classified: Secondary | ICD-10-CM

## 2019-05-24 DIAGNOSIS — R600 Localized edema: Secondary | ICD-10-CM

## 2019-05-24 DIAGNOSIS — M6281 Muscle weakness (generalized): Secondary | ICD-10-CM

## 2019-05-24 DIAGNOSIS — M25571 Pain in right ankle and joints of right foot: Secondary | ICD-10-CM

## 2019-05-24 NOTE — Therapy (Signed)
[facility] [address] [place], [place], [address] Phone: [phone]   Fax:  [phone]  Physical Therapy Treatment  Patient Details  Name: Rita Long MRN: [id number] Date of Birth: [birth year] Referring Provider (PT): Rodell Perna, MD   Encounter Date: 05/24/2019  PT End of Session - 05/24/19 1527    Visit Number  23    PT Start Time  6333    PT Stop Time  1537    PT Time Calculation (min)  50 min    Activity Tolerance  Patient tolerated treatment well    Behavior During Therapy  [facility] for tasks assessed/performed       Past Medical History:  Diagnosis Date  . Fibroid    "unsure"    Past Surgical History:  Procedure Laterality Date  . CESAREAN SECTION  07/01/2011   Procedure: CESAREAN SECTION;  Surgeon: Alwyn Pea, MD;  Location: [facility];  Service: Gynecology;  Laterality: N/A;  . ORIF ANKLE FRACTURE Right 01/18/2019   Procedure: OPEN REDUCTION INTERNAL FIXATION (ORIF) RIGHT ANKLE( BI-MALEOLAR-POSTERIOR AND LATERAL);  Surgeon: Marybelle Killings, MD;  Location: [facility];  Service: Orthopedics;  Laterality: Right;  POPLITEAL BLOCK  . WISDOM TOOTH EXTRACTION  2015    There were no vitals filed for this visit.  Subjective Assessment - 05/24/19 1456    Subjective  Rt medial ankle is still sore from carrying water.  I have been trying to wean from my brace.    Currently in Pain?  Yes    Pain Score  2     Pain Location  Ankle    Pain Orientation  Right    Pain Descriptors / Indicators  Sore         [facility] PT Assessment - 05/24/19 0001      Assessment   Medical Diagnosis  closed bimalleolar fracture of Rt ankle with routine healing    Referring Provider (PT)  Rodell Perna, MD    Onset Date/Surgical Date  01/18/19      Observation/Other Assessments   Focus on Therapeutic Outcomes (FOTO)   30% limitation      AROM   Right Ankle Dorsiflexion  0    Right Ankle Plantar Flexion  30    Right Ankle  Inversion  30    Right Ankle Eversion  20      PROM   Right Ankle Dorsiflexion  8    Right Ankle Plantar Flexion  45    Right Ankle Inversion  30    Right Ankle Eversion  23      Strength   Overall Strength Comments  5/5 DF, inversion and eversion      Ambulation/Gait   Ambulation/Gait  Yes    Gait Pattern  Within Functional Limits;Decreased dorsiflexion - right                   [facility] Adult PT Treatment/Exercise - 05/24/19 0001      Knee/Hip Exercises: Standing   Heel Raises  Both;2 sets;15 reps    Heel Raises Limitations  on black balance pad    Forward Step Up  Right;2 sets;10 reps    Forward Step Up Limitations  on Bosu ball    SLS  with plyotoss red ball 2x10, tandem stance: Rt and Lt 2x10 each    Walking with Sports Cord  30# 4 ways x 10 each      Vasopneumatic   Number Minutes Vasopneumatic   10 minutes  Vasopnuematic Location   Ankle    Vasopneumatic Pressure  Medium    Vasopneumatic Temperature   3 snowflakes      Ankle Exercises: Aerobic   Elliptical  L4 R 5 7 min with discussion of status      Ankle Exercises: Machines for Strengthening   Cybex Leg Press  Seat 3 RTLE 50# 2x15, calf press 35# 2x 20, 70# bil legs               PT Short Term Goals - 04/27/19 0827      PT SHORT TERM GOAL #4   Title  ambulate on level surface with 1 crutch an full weightbearing on Rt LE    Status  Achieved        PT Long Term Goals - 05/24/19 1459      PT LONG TERM GOAL #1   Title  be independent in advanced HEP    Status  Achieved      PT LONG TERM GOAL #2   Title  reduce FOTO to < or 34% limitation    Baseline  30% limitation    Status  Achieved      PT LONG TERM GOAL #3   Title  demonstrate 4 to 4+/5 Rt ankle strength to improve stability with gait on unlevel surfaces    Status  Achieved      PT LONG TERM GOAL #4   Title  demonstrate Rt ankle A/ROM DF to 8 degrees to normalize gait    Baseline  0 degrees    Status  Partially Met       PT LONG TERM GOAL #5   Title  ambulate on level surfaces without boot and demonstrate symmetry    Status  Achieved      PT LONG TERM GOAL #6   Title  improve strength and endurance to stand and walk for 1 hour to allow for return to independence at home and in the community    Baseline  1 hour    Status  Achieved            Plan - 05/24/19 1515    Clinical Impression Statement  Pt is ready to D/C to HEP.  Pt is weaning from wearing her ASO and has improving stability on unlevel surfaces.  Pt with improved Rt ankle A/ROM and strength to normal.  Rt ankle DF A/ROM is limited to 0 degrees and pt will continue to work on flexibility.  Pt is able to walk for up to 1 hour without limitation and experiences Rt ankle pain with heavy lifting and long distance ambulation.  Pt will follow-up with MD as needed after discharge    PT Next Visit Plan  D/C PT to HEP    PT Home Exercise Plan  Access Code: [email or web address]    Consulted and Agree with Plan of Care  Patient       Patient will benefit from skilled therapeutic intervention in order to improve the following deficits and impairments:     Visit Diagnosis: Stiffness of right ankle, not elsewhere classified  Other abnormalities of gait and mobility  Muscle weakness (generalized)  Localized edema  Pain in right ankle and joints of right foot     Problem List Patient Active Problem List   Diagnosis Date Noted  . Bimalleolar fracture of right ankle 01/18/2019  . Closed right ankle fracture 01/18/2019  . Fibroid 09/23/2011  . Short stature 09/23/2011  PHYSICAL THERAPY DISCHARGE SUMMARY  Visits  from Start of Care: 23  Current functional level related to goals / functional outcomes: See above for current status   Remaining deficits: Mild Rt antalgia on unlevel surfaces.  Pt has HEP in place to address remaining deficits.     Education / Equipment: HEP Plan: Patient agrees to discharge.  Patient goals were met. Patient is being  discharged due to meeting the stated rehab goals.  ?????         Sigurd Sos, PT 05/24/19 3:29 PM  [facility] [address], [place], [place], [address] Phone: [phone]   Fax:  [phone]  Name: Rita Long MRN: [id number] Date of Birth: 21-Jan-1991

## 2019-09-01 ENCOUNTER — Ambulatory Visit: Payer: BLUE CROSS/BLUE SHIELD | Attending: Internal Medicine

## 2019-09-01 DIAGNOSIS — Z23 Encounter for immunization: Secondary | ICD-10-CM

## 2019-09-01 NOTE — Progress Notes (Signed)
   Covid-19 Vaccination Clinic  Name:  Rita Long    MRN: [id number] DOB: 21-Jun-1990  09/01/2019  [name] was observed post Covid-19 immunization for 15 minutes without incident. She was provided with Vaccine Information Sheet and instruction to access the V-Safe system.   [name] was instructed to call 911 with any severe reactions post vaccine: Marland Kitchen Difficulty breathing  . Swelling of face and throat  . A fast heartbeat  . A bad rash all over body  . Dizziness and weakness   Immunizations Administered    Name Date Dose VIS Date Route   Pfizer COVID-19 Vaccine 09/01/2019  1:51 PM 0.3 mL 05/26/2019 Intramuscular   Manufacturer: [place]   Lot: [id number]   [place]: [id number]

## 2019-09-27 ENCOUNTER — Ambulatory Visit: Payer: BLUE CROSS/BLUE SHIELD | Attending: Internal Medicine

## 2019-09-27 DIAGNOSIS — Z23 Encounter for immunization: Secondary | ICD-10-CM

## 2019-09-27 NOTE — Progress Notes (Signed)
   Covid-19 Vaccination Clinic  Name:  Rita Long    MRN: [id number] DOB: Mar 12, 1991  09/27/2019  Rita Long was observed post Covid-19 immunization for 15 minutes without incident. She was provided with Vaccine Information Sheet and instruction to access the V-Safe system.   Rita Long was instructed to call 911 with any severe reactions post vaccine: Marland Kitchen Difficulty breathing  . Swelling of face and throat  . A fast heartbeat  . A bad rash all over body  . Dizziness and weakness   Immunizations Administered    Name Date Dose VIS Date Route   Pfizer COVID-19 Vaccine 09/27/2019  3:08 PM 0.3 mL 05/26/2019 Intramuscular   Manufacturer: [place]   Lot: [id number]   [place]: [id number]

## 2019-09-30 ENCOUNTER — Encounter: Payer: Self-pay | Admitting: Orthopaedic Surgery

## 2019-10-06 ENCOUNTER — Encounter: Payer: Self-pay | Admitting: Orthopaedic Surgery

## 2019-10-06 ENCOUNTER — Ambulatory Visit (INDEPENDENT_AMBULATORY_CARE_PROVIDER_SITE_OTHER): Payer: BLUE CROSS/BLUE SHIELD | Admitting: Orthopaedic Surgery

## 2019-10-06 ENCOUNTER — Ambulatory Visit: Payer: Self-pay

## 2019-10-06 ENCOUNTER — Other Ambulatory Visit: Payer: Self-pay

## 2019-10-06 VITALS — BP 130/89 | HR 72 | Ht 60.0 in | Wt 180.0 lb

## 2019-10-06 DIAGNOSIS — M25571 Pain in right ankle and joints of right foot: Secondary | ICD-10-CM

## 2019-10-06 DIAGNOSIS — R21 Rash and other nonspecific skin eruption: Secondary | ICD-10-CM

## 2019-10-06 NOTE — Progress Notes (Signed)
Office Visit Note   Patient: Rita Long           Date of Birth: February 01, 1991           MRN: [id number] Visit Date: 10/06/2019              Requested by: Delsa Bern, MD [address] [place]  [address] PCP: Delsa Bern, MD   Assessment & Plan: Visit Diagnoses:  1. Pain in right ankle and joints of right foot   2. Rash   3. Rash and nonspecific skin eruption     Plan: Will make appointment for patient see dermatologist.  X-ray showed complete healing of the fracture.  I discussed with her I do not think it is related to the fixation of her ankle.  Follow-Up Instructions: Return if symptoms worsen or fail to improve.   Orders:  Orders Placed This Encounter  Procedures  . XR Ankle Complete Right  . Ambulatory referral to Dermatology   No orders of the defined types were placed in this encounter.     Procedures: No procedures performed   Clinical Data: No additional findings.   Subjective: Chief Complaint  Patient presents with  . Right Ankle - Pain    HPI 29 year old female post-ORIF right bimalleolar ankle fracture 01/18/2019 presents with 1 to 14-month history of rash over the distal leg proximal to the ankle anteriorly.  She states the rash came out of nowhere she is a applied some hydrocortisone cream twice a day.  She states it is been peeling it itches sometimes been painful and it has macular papular appearance in an area about the size of a ping-pong ball.  No area of rash over her incision.  Patient is questioning whether it could be that she is allergic to the metal in the plates.  She states she has had some discomfort between her second and third toe sometimes slightly itching.  Review of Systems 14 point update unchanged from surgery date in August 2020 other than as mentioned in HPI.  Objective: Vital Signs: BP 130/89   Pulse 72   Ht 5' (1.524 m)   Wt 180 lb (81.6 kg)   BMI 35.15 kg/m   Physical Exam Constitutional:       Appearance: She is well-developed.  HENT:     Head: Normocephalic.     Right Ear: External ear normal.     Left Ear: External ear normal.  Eyes:     Pupils: Pupils are equal, round, and reactive to light.  Neck:     Thyroid: No thyromegaly.     Trachea: No tracheal deviation.  Cardiovascular:     Rate and Rhythm: Normal rate.  Pulmonary:     Effort: Pulmonary effort is normal.  Abdominal:     Palpations: Abdomen is soft.  Skin:    General: Skin is warm and dry.  Neurological:     Mental Status: She is alert and oriented to person, place, and time.  Psychiatric:        Behavior: Behavior normal.     Ortho Exam lateral ankle incision is healed.  Maculopapular oval to round area proximal to ankle.  No plantar foot foot lesions.  Anterior leg screw in the tibia is not particularly tender no tenderness with percussion that would suggest irritation of the deep peroneal nerve.  Lateral incision is nontender.  Specialty Comments:  No specialty comments available.  Imaging: No results found.   PMFS History: Patient Active  Problem List   Diagnosis Date Noted  . Rash and nonspecific skin eruption 10/09/2019  . Bimalleolar fracture of right ankle 01/18/2019  . Closed right ankle fracture 01/18/2019  . Fibroid 09/23/2011  . Short stature 09/23/2011   Past Medical History:  Diagnosis Date  . Fibroid    "unsure"    Family History  Problem Relation Age of Onset  . Hypertension Mother   . Anesthesia problems Neg Hx   . Hypotension Neg Hx   . Malignant hyperthermia Neg Hx   . Pseudochol deficiency Neg Hx     Past Surgical History:  Procedure Laterality Date  . CESAREAN SECTION  07/01/2011   Procedure: CESAREAN SECTION;  Surgeon: Alwyn Pea, MD;  Location: [facility];  Service: Gynecology;  Laterality: N/A;  . ORIF ANKLE FRACTURE Right 01/18/2019   Procedure: OPEN REDUCTION INTERNAL FIXATION (ORIF) RIGHT ANKLE( BI-MALEOLAR-POSTERIOR AND LATERAL);  Surgeon: Marybelle Killings,  MD;  Location: [facility];  Service: Orthopedics;  Laterality: Right;  POPLITEAL BLOCK  . WISDOM TOOTH EXTRACTION  2015   Social History   Occupational History  . Not on file  Tobacco Use  . Smoking status: Former Smoker    Packs/day: 0.25    Quit date: 06/22/2010    Years since quitting: 9.3  . Smokeless tobacco: Never Used  . Tobacco comment: not currently smoking or using marijuana  Substance and Sexual Activity  . Alcohol use: No  . Drug use: No    Types: Marijuana, Other-see comments    Comment: pt states no drugs at this visit  . Sexual activity: Not Currently    Birth control/protection: I.U.D.    Comment: Mirena 09-08-11

## 2019-10-09 DIAGNOSIS — R21 Rash and other nonspecific skin eruption: Secondary | ICD-10-CM | POA: Insufficient documentation

## 2019-10-16 ENCOUNTER — Encounter: Payer: Self-pay | Admitting: Dermatology

## 2019-10-16 ENCOUNTER — Other Ambulatory Visit: Payer: Self-pay

## 2019-10-16 ENCOUNTER — Ambulatory Visit (INDEPENDENT_AMBULATORY_CARE_PROVIDER_SITE_OTHER): Payer: BLUE CROSS/BLUE SHIELD | Admitting: Dermatology

## 2019-10-16 DIAGNOSIS — L309 Dermatitis, unspecified: Secondary | ICD-10-CM

## 2019-10-16 MED ORDER — HALOBETASOL PROPIONATE 0.05 % EX CREA: TOPICAL_CREAM | Freq: Every day | CUTANEOUS | 1 refills | Status: AC

## 2019-10-16 NOTE — Progress Notes (Signed)
   New Patient   Subjective  Rita Long is a 29 y.o. female who presents for the following: Rash (rash on right foot for months started after patient had PT & ankle fracture treatment otc hydrocortisone).  Rash Location: Right lower leg and foot Duration: Several months Quality: Chronic Associated Signs/Symptoms: Some itching Modifying Factors: Broken ankle and had screws and plates permanently put in right outer ankle last fall Severity:  Timing: Context:    The following portions of the chart were reviewed this encounter and updated as appropriate:   First visit for [place] who last August fall had a complex fracture in her right ankle repaired with nuts and bolts and plates.  Some 8 weeks later she started noticing breaking out on the distal leg top of the foot near the toes.  This waxes and wanes in severity but at times there is enough swelling on the toes to almost look blistered or pustular.  Examination today showed a large nummular patch of chronic dermatitis on the lower ankle with less involvement on the distal foot and the right outer ankle near the surgical scar.  I explained to Rita Long that this could represent either adult onset nummular eczema, perhaps triggered by the injury or contact allergic dermatitis to one of the metallic components that is left after her surgical repair.  We did discuss referral to Rita Long to patch test for the orthopedic materials.  For now she will apply halobetasol to the affected area once daily after bathing for the next month; if there is good improvement then she will taper the use.  She understands that the dark spots may persist long after the active rash.  Initial follow-up by telephone in 4 weeks.  Objective  Well appearing patient in no apparent distress; mood and affect are within normal limits.  Hand, feet, legs, nails examined   Assessment & Plan  dermatitis, Rx halobetasol qd for one month.

## 2019-10-17 ENCOUNTER — Encounter: Payer: Self-pay | Admitting: Dermatology

## 2023-03-25 ENCOUNTER — Other Ambulatory Visit: Payer: Self-pay

## 2023-03-25 ENCOUNTER — Encounter (HOSPITAL_COMMUNITY): Payer: Self-pay

## 2023-03-25 ENCOUNTER — Emergency Department (HOSPITAL_COMMUNITY)
Admission: EM | Admit: 2023-03-25 | Discharge: 2023-03-26 | Discharge status: Against Medical Advice | Payer: BC Managed Care – PPO | Attending: Emergency Medicine | Admitting: Emergency Medicine

## 2023-03-25 DIAGNOSIS — M546 Pain in thoracic spine: Secondary | ICD-10-CM | POA: Insufficient documentation

## 2023-03-25 DIAGNOSIS — R0602 Shortness of breath: Secondary | ICD-10-CM | POA: Diagnosis not present

## 2023-03-25 DIAGNOSIS — Z5321 Procedure and treatment not carried out due to patient leaving prior to being seen by health care provider: Secondary | ICD-10-CM | POA: Diagnosis not present

## 2023-03-25 LAB — CBC WITH DIFFERENTIAL/PLATELET
Abs Immature Granulocytes: 0 10*3/uL (ref 0.00–0.07)
Basophils Absolute: 0 10*3/uL (ref 0.0–0.1)
Basophils Relative: 0 %
Eosinophils Absolute: 0.1 10*3/uL (ref 0.0–0.5)
Eosinophils Relative: 2 %
HCT: 36.8 % (ref 36.0–46.0)
Hemoglobin: 12.1 g/dL (ref 12.0–15.0)
Immature Granulocytes: 0 %
Lymphocytes Relative: 32 %
Lymphs Abs: 1.7 10*3/uL (ref 0.7–4.0)
MCH: 32.4 pg (ref 26.0–34.0)
MCHC: 32.9 g/dL (ref 30.0–36.0)
MCV: 98.4 fL (ref 80.0–100.0)
Monocytes Absolute: 0.7 10*3/uL (ref 0.1–1.0)
Monocytes Relative: 13 %
Neutro Abs: 2.9 10*3/uL (ref 1.7–7.7)
Neutrophils Relative %: 53 %
Platelets: 293 10*3/uL (ref 150–400)
RBC: 3.74 MIL/uL — ABNORMAL LOW (ref 3.87–5.11)
RDW: 11.9 % (ref 11.5–15.5)
WBC: 5.5 10*3/uL (ref 4.0–10.5)
nRBC: 0 % (ref 0.0–0.2)

## 2023-03-25 LAB — BASIC METABOLIC PANEL
Anion gap: 11 (ref 5–15)
BUN: 14 mg/dL (ref 6–20)
CO2: 22 mmol/L (ref 22–32)
Calcium: 9.1 mg/dL (ref 8.9–10.3)
Chloride: 105 mmol/L (ref 98–111)
Creatinine, Ser: 0.6 mg/dL (ref 0.44–1.00)
GFR, Estimated: >60 mL/min (ref >60)
Glucose, Bld: 110 mg/dL — ABNORMAL HIGH (ref 70–99)
Potassium: 3.7 mmol/L (ref 3.5–5.1)
Sodium: 138 mmol/L (ref 135–145)

## 2023-03-25 LAB — HCG, SERUM, QUALITATIVE: Preg, Serum: NEGATIVE

## 2023-03-25 NOTE — ED Notes (Signed)
Pt left due to wait time  

## 2023-03-25 NOTE — ED Triage Notes (Signed)
Pt c/o mid back pain and SOB, SOB worse at night. Pt states it's painful to lay on her back or abdomenx5d. Pt sent by PCP to r/o PE. Pt is eupneic.
